# Patient Record
Sex: Male | Born: 1960 | Race: White | Hispanic: No | Marital: Married | Smoking: Former smoker
Health system: Southern US, Community
[De-identification: ages and names within clinical notes are randomized; demographics above are authoritative.]

## PROBLEM LIST (undated history)

## (undated) DIAGNOSIS — T7840XA Allergy, unspecified, initial encounter: Secondary | ICD-10-CM

## (undated) DIAGNOSIS — I219 Acute myocardial infarction, unspecified: Secondary | ICD-10-CM

## (undated) DIAGNOSIS — R001 Bradycardia, unspecified: Secondary | ICD-10-CM

## (undated) DIAGNOSIS — R319 Hematuria, unspecified: Secondary | ICD-10-CM

## (undated) DIAGNOSIS — E785 Hyperlipidemia, unspecified: Secondary | ICD-10-CM

## (undated) DIAGNOSIS — K5190 Ulcerative colitis, unspecified, without complications: Secondary | ICD-10-CM

## (undated) DIAGNOSIS — R569 Unspecified convulsions: Secondary | ICD-10-CM

## (undated) DIAGNOSIS — I2510 Atherosclerotic heart disease of native coronary artery without angina pectoris: Secondary | ICD-10-CM

## (undated) DIAGNOSIS — C801 Malignant (primary) neoplasm, unspecified: Secondary | ICD-10-CM

## (undated) HISTORY — DX: Acute myocardial infarction, unspecified: I21.9

## (undated) HISTORY — DX: Allergy, unspecified, initial encounter: T78.40XA

## (undated) HISTORY — PX: BIOPSY PROSTATE: PRO28

---

## 1988-04-28 HISTORY — PX: ANTERIOR CRUCIATE LIGAMENT REPAIR: SHX115

## 2004-12-26 ENCOUNTER — Ambulatory Visit (HOSPITAL_COMMUNITY): Admission: RE | Admit: 2004-12-26 | Discharge: 2004-12-26 | Payer: Self-pay | Admitting: Gastroenterology

## 2013-05-16 ENCOUNTER — Other Ambulatory Visit (HOSPITAL_COMMUNITY): Payer: Self-pay | Admitting: Urology

## 2013-05-16 DIAGNOSIS — R972 Elevated prostate specific antigen [PSA]: Secondary | ICD-10-CM

## 2013-05-29 DIAGNOSIS — C801 Malignant (primary) neoplasm, unspecified: Secondary | ICD-10-CM

## 2013-05-29 HISTORY — DX: Malignant (primary) neoplasm, unspecified: C80.1

## 2013-05-31 ENCOUNTER — Ambulatory Visit (HOSPITAL_COMMUNITY)
Admission: RE | Admit: 2013-05-31 | Discharge: 2013-05-31 | Disposition: A | Discharge status: Home / Self Care | Payer: 59 | Source: Ambulatory Visit | Attending: Urology | Admitting: Urology

## 2013-05-31 DIAGNOSIS — N402 Nodular prostate without lower urinary tract symptoms: Secondary | ICD-10-CM | POA: Insufficient documentation

## 2013-05-31 DIAGNOSIS — N400 Enlarged prostate without lower urinary tract symptoms: Secondary | ICD-10-CM | POA: Insufficient documentation

## 2013-05-31 DIAGNOSIS — R972 Elevated prostate specific antigen [PSA]: Secondary | ICD-10-CM | POA: Insufficient documentation

## 2013-05-31 MED ORDER — GADOBENATE DIMEGLUMINE 529 MG/ML IV SOLN
18.0000 mL | Freq: Once | INTRAVENOUS | Status: AC | PRN
  Administered 2013-05-31: 18 mL via INTRAVENOUS

## 2013-07-27 ENCOUNTER — Encounter (HOSPITAL_COMMUNITY): Payer: Self-pay | Admitting: Emergency Medicine

## 2013-07-27 ENCOUNTER — Encounter (HOSPITAL_COMMUNITY)
Admission: EM | Disposition: A | Discharge status: Home / Self Care | Payer: 59 | Source: Home / Self Care | Attending: Interventional Cardiology

## 2013-07-27 ENCOUNTER — Emergency Department (HOSPITAL_COMMUNITY): Payer: 59

## 2013-07-27 ENCOUNTER — Inpatient Hospital Stay (HOSPITAL_COMMUNITY)
Admission: EM | Admit: 2013-07-27 | Discharge: 2013-08-01 | DRG: 251 | Disposition: A | Discharge status: Home / Self Care | Payer: 59 | Attending: Interventional Cardiology | Admitting: Interventional Cardiology

## 2013-07-27 DIAGNOSIS — Z87891 Personal history of nicotine dependence: Secondary | ICD-10-CM

## 2013-07-27 DIAGNOSIS — R319 Hematuria, unspecified: Secondary | ICD-10-CM | POA: Diagnosis present

## 2013-07-27 DIAGNOSIS — I2510 Atherosclerotic heart disease of native coronary artery without angina pectoris: Secondary | ICD-10-CM

## 2013-07-27 DIAGNOSIS — R001 Bradycardia, unspecified: Secondary | ICD-10-CM

## 2013-07-27 DIAGNOSIS — I959 Hypotension, unspecified: Secondary | ICD-10-CM | POA: Diagnosis present

## 2013-07-27 DIAGNOSIS — R569 Unspecified convulsions: Secondary | ICD-10-CM | POA: Diagnosis present

## 2013-07-27 DIAGNOSIS — K5190 Ulcerative colitis, unspecified, without complications: Secondary | ICD-10-CM

## 2013-07-27 DIAGNOSIS — I213 ST elevation (STEMI) myocardial infarction of unspecified site: Secondary | ICD-10-CM

## 2013-07-27 DIAGNOSIS — C61 Malignant neoplasm of prostate: Secondary | ICD-10-CM

## 2013-07-27 DIAGNOSIS — K921 Melena: Secondary | ICD-10-CM | POA: Diagnosis present

## 2013-07-27 DIAGNOSIS — E785 Hyperlipidemia, unspecified: Secondary | ICD-10-CM | POA: Diagnosis present

## 2013-07-27 DIAGNOSIS — I2582 Chronic total occlusion of coronary artery: Secondary | ICD-10-CM | POA: Diagnosis present

## 2013-07-27 DIAGNOSIS — Z8546 Personal history of malignant neoplasm of prostate: Secondary | ICD-10-CM

## 2013-07-27 DIAGNOSIS — I2119 ST elevation (STEMI) myocardial infarction involving other coronary artery of inferior wall: Principal | ICD-10-CM

## 2013-07-27 HISTORY — DX: Ulcerative colitis, unspecified, without complications: K51.90

## 2013-07-27 HISTORY — DX: Hematuria, unspecified: R31.9

## 2013-07-27 HISTORY — DX: Unspecified convulsions: R56.9

## 2013-07-27 HISTORY — PX: LEFT HEART CATHETERIZATION WITH CORONARY ANGIOGRAM: SHX5451

## 2013-07-27 HISTORY — DX: Bradycardia, unspecified: R00.1

## 2013-07-27 HISTORY — DX: Malignant (primary) neoplasm, unspecified: C80.1

## 2013-07-27 LAB — POCT I-STAT TROPONIN I: Troponin i, poc: 0.01 ng/mL (ref 0.00–0.08)

## 2013-07-27 LAB — CBC
HCT: 44.1 % (ref 39.0–52.0)
HEMATOCRIT: 36.2 % — AB (ref 39.0–52.0)
Hemoglobin: 15.5 g/dL (ref 13.0–17.0)
Hemoglobin: 12.8 g/dL — ABNORMAL LOW (ref 13.0–17.0)
MCH: 29.5 pg (ref 26.0–34.0)
MCH: 29.6 pg (ref 26.0–34.0)
MCHC: 35.1 g/dL (ref 30.0–36.0)
MCHC: 35.4 g/dL (ref 30.0–36.0)
MCV: 84 fL (ref 78.0–100.0)
MCV: 83.8 fL (ref 78.0–100.0)
Platelets: 296 K/uL (ref 150–400)
Platelets: 265 10*3/uL (ref 150–400)
RBC: 5.25 MIL/uL (ref 4.22–5.81)
RBC: 4.32 MIL/uL (ref 4.22–5.81)
RDW: 12.4 % (ref 11.5–15.5)
RDW: 12.5 % (ref 11.5–15.5)
WBC: 7.9 K/uL (ref 4.0–10.5)
WBC: 9.2 10*3/uL (ref 4.0–10.5)

## 2013-07-27 LAB — POCT I-STAT, CHEM 8
BUN: 14 mg/dL (ref 6–23)
CALCIUM ION: 1.19 mmol/L (ref 1.12–1.23)
CHLORIDE: 107 meq/L (ref 96–112)
Creatinine, Ser: 0.8 mg/dL (ref 0.50–1.35)
Glucose, Bld: 150 mg/dL — ABNORMAL HIGH (ref 70–99)
HEMATOCRIT: 42 % (ref 39.0–52.0)
Hemoglobin: 14.3 g/dL (ref 13.0–17.0)
Potassium: 3.6 mEq/L — ABNORMAL LOW (ref 3.7–5.3)
SODIUM: 140 meq/L (ref 137–147)
TCO2: 17 mmol/L (ref 0–100)

## 2013-07-27 LAB — BASIC METABOLIC PANEL WITH GFR
BUN: 15 mg/dL (ref 6–23)
CO2: 20 meq/L (ref 19–32)
Calcium: 9.3 mg/dL (ref 8.4–10.5)
Chloride: 101 meq/L (ref 96–112)
Creatinine, Ser: 0.85 mg/dL (ref 0.50–1.35)
GFR calc Af Amer: >90 mL/min
GFR calc non Af Amer: >90 mL/min
Glucose, Bld: 160 mg/dL — ABNORMAL HIGH (ref 70–99)
Potassium: 4 meq/L (ref 3.7–5.3)
Sodium: 138 meq/L (ref 137–147)

## 2013-07-27 LAB — POCT ACTIVATED CLOTTING TIME
ACTIVATED CLOTTING TIME: 221 s
ACTIVATED CLOTTING TIME: 237 s
ACTIVATED CLOTTING TIME: 304 s
Activated Clotting Time: 293 seconds

## 2013-07-27 LAB — CK TOTAL AND CKMB (NOT AT ARMC)
CK, MB: 62.6 ng/mL (ref 0.3–4.0)
Relative Index: 8.2 — ABNORMAL HIGH (ref 0.0–2.5)
Total CK: 763 U/L — ABNORMAL HIGH (ref 7–232)

## 2013-07-27 LAB — MRSA PCR SCREENING: MRSA BY PCR: NEGATIVE

## 2013-07-27 LAB — PRO B NATRIURETIC PEPTIDE: Pro B Natriuretic peptide (BNP): 26.4 pg/mL (ref 0–125)

## 2013-07-27 SURGERY — LEFT HEART CATHETERIZATION WITH CORONARY ANGIOGRAM
Anesthesia: LOCAL

## 2013-07-27 MED ORDER — TIROFIBAN HCL IV 12.5 MG/250 ML
0.1500 ug/kg/min | INTRAVENOUS | Status: DC
  Administered 2013-07-27 – 2013-07-28 (×3): 0.15 ug/kg/min via INTRAVENOUS
  Filled 2013-07-27: qty 250
  Filled 2013-07-27 (×3): qty 100
  Filled 2013-07-27: qty 250
  Filled 2013-07-27: qty 100
  Filled 2013-07-27: qty 250
  Filled 2013-07-27: qty 100
  Filled 2013-07-27: qty 250

## 2013-07-27 MED ORDER — HEPARIN (PORCINE) IN NACL 2-0.9 UNIT/ML-% IJ SOLN
INTRAMUSCULAR | Status: AC
  Filled 2013-07-27: qty 1000

## 2013-07-27 MED ORDER — PRASUGREL HCL 10 MG PO TABS
ORAL_TABLET | ORAL | Status: AC
  Filled 2013-07-27: qty 1

## 2013-07-27 MED ORDER — SODIUM CHLORIDE 0.9 % IV SOLN: INTRAVENOUS | Status: DC

## 2013-07-27 MED ORDER — LIDOCAINE HCL (PF) 1 % IJ SOLN
INTRAMUSCULAR | Status: AC
  Filled 2013-07-27: qty 30

## 2013-07-27 MED ORDER — TIROFIBAN HCL IV 5 MG/100ML
INTRAVENOUS | Status: AC
  Filled 2013-07-27: qty 100

## 2013-07-27 MED ORDER — NITROGLYCERIN 0.4 MG SL SUBL
0.4000 mg | SUBLINGUAL_TABLET | SUBLINGUAL | Status: DC | PRN
  Administered 2013-07-27 – 2013-07-28 (×2): 0.4 mg via SUBLINGUAL

## 2013-07-27 MED ORDER — PRASUGREL HCL 10 MG PO TABS
10.0000 mg | ORAL_TABLET | Freq: Every day | ORAL | Status: DC
  Administered 2013-07-28: 10 mg via ORAL
  Filled 2013-07-27 (×2): qty 1

## 2013-07-27 MED ORDER — FENTANYL CITRATE 0.05 MG/ML IJ SOLN
INTRAMUSCULAR | Status: AC
  Filled 2013-07-27: qty 2

## 2013-07-27 MED ORDER — SODIUM CHLORIDE 0.9 % IV SOLN
INTRAVENOUS | Status: AC
  Administered 2013-07-27: 18:00:00 via INTRAVENOUS

## 2013-07-27 MED ORDER — ASPIRIN 325 MG PO TABS
325.0000 mg | ORAL_TABLET | ORAL | Status: AC
  Administered 2013-07-27: 325 mg via ORAL
  Filled 2013-07-27: qty 1

## 2013-07-27 MED ORDER — HEPARIN SODIUM (PORCINE) 1000 UNIT/ML IJ SOLN
INTRAMUSCULAR | Status: AC
  Filled 2013-07-27: qty 1

## 2013-07-27 MED ORDER — HEPARIN SODIUM (PORCINE) 5000 UNIT/ML IJ SOLN
4000.0000 [IU] | Freq: Once | INTRAMUSCULAR | Status: AC
  Administered 2013-07-27: 4000 [IU] via INTRAVENOUS

## 2013-07-27 MED ORDER — MIDAZOLAM HCL 2 MG/2ML IJ SOLN
INTRAMUSCULAR | Status: AC
  Filled 2013-07-27: qty 2

## 2013-07-27 MED ORDER — ASPIRIN 81 MG PO CHEW
81.0000 mg | CHEWABLE_TABLET | Freq: Every day | ORAL | Status: DC
  Administered 2013-07-28 – 2013-08-01 (×5): 81 mg via ORAL
  Filled 2013-07-27 (×5): qty 1

## 2013-07-27 MED ORDER — HEPARIN SODIUM (PORCINE) 5000 UNIT/ML IJ SOLN
INTRAMUSCULAR | Status: AC
  Filled 2013-07-27: qty 1

## 2013-07-27 MED ORDER — NITROGLYCERIN 0.2 MG/ML ON CALL CATH LAB
INTRAVENOUS | Status: AC
  Filled 2013-07-27: qty 1

## 2013-07-27 MED ORDER — VERAPAMIL HCL 2.5 MG/ML IV SOLN
INTRAVENOUS | Status: AC
  Filled 2013-07-27: qty 2

## 2013-07-27 NOTE — ED Notes (Addendum)
Pt reports CP starting 20 min ago, in center chest while using the bathroom.  States it feels like heartburn.  Denies radiation.  Pt reports pain in right arm.  Reports hx of ulcerative colitis.  Denies hx of chest pain.  Denies hx HTN and diabetes.  Pt reports feeling lightheaded.  Denies nausea.  Reports diaphoresis. Denies fever/chills.  Pt reports jaw pain.  Reports some SOB from pain in chest.  No acute distress, respirations equal and unlabored.  Skin warm and dry.

## 2013-07-27 NOTE — Progress Notes (Signed)
Right arm swollen from above wrist to elbow.  IV has good blood return and flushes easily.  Right radial band site level 0 under band, but some bruising noted towards elbow.  Consulted with cath lab staff at time of bedside report, site is the same as in the cath lab. Will continue to monitor pt closely.

## 2013-07-27 NOTE — Progress Notes (Signed)
Positive troponins and CKMBs are expected per Tenny Craw, PA.

## 2013-07-27 NOTE — ED Notes (Signed)
Patient transported to Cath lab

## 2013-07-27 NOTE — ED Notes (Signed)
Defib pads applied

## 2013-07-27 NOTE — Progress Notes (Signed)
CRITICAL VALUE ALERT  Critical value received: CKMB  Date of notification:  07/27/2013  Time of notification:  5844  Critical value read back:yes  Nurse who received alert:  Carleene Cooper  MD notified (1st page):  Tenny Craw  Time of first page:  [phone]  MD notified (2nd page):  Time of second page:  Responding MD:  Tenny Craw  Time MD responded:  [phone]

## 2013-07-27 NOTE — ED Notes (Signed)
STEMI coordinator arrives

## 2013-07-27 NOTE — Progress Notes (Signed)
Responded to code stemi  page to provide support to patient and family. Pt report to ED and was immediately taken to cath lab.  Pt was alert. Accompanied staff to cath lab and escorted wife to waiting area. Provided ministry of presence, empathic listening and hospitality. Will follow as needed.  07/27/13 0900  Clinical Encounter Type  Visited With Patient and family together;Health care provider  Visit Type Initial;Spiritual support;Pre-op  Referral From Nurse  Spiritual Encounters  Spiritual Needs Emotional  Stress Factors  Patient Stress Factors None identified  Family Stress Factors None identified  Jaclynn Major, [place]

## 2013-07-27 NOTE — ED Provider Notes (Addendum)
CSN: [id number]     Arrival date & time 07/27/13  0160 History   First MD Initiated Contact with Patient 07/27/13 [phone]     Chief Complaint  Patient presents with  . Chest Pain     (Consider location/radiation/quality/duration/timing/severity/associated sxs/prior Treatment) Patient is a 53 y.o. male presenting with chest pain. The history is provided by the patient and the spouse.  Chest Pain Associated symptoms: shortness of breath   Associated symptoms: no abdominal pain, no back pain, no fever, no headache, no nausea and not vomiting    onset of chest pain at 8:00 in the morning of right-sided substernal radiating to right arm up into the jaw and then started to radiate towards the left arm did not radiate to the back. Pain was 8/10. Some shortness of breath no nausea no vomiting. Stated felt some lightheadedness but did not pass out significant diaphoresis. No history of any similar type pain. Pain sharp in nature.  Past Medical History  Diagnosis Date  . Seizures     last seizure 26 years ago  . Cancer 05/29/2013    prostate  . Ulcerative colitis   . Elevated cholesterol    Past Surgical History  Procedure Laterality Date  . Biopsy prostate     History reviewed. No pertinent family history. History  Substance Use Topics  . Smoking status: Former Smoker    Types: Cigars    Quit date: 07/27/2008  . Smokeless tobacco: Never Used  . Alcohol Use: Yes     Comment: occassional beer or wine, < 1 per week    Review of Systems  Constitutional: Negative for fever.  HENT: Negative for congestion.   Eyes: Negative for redness.  Respiratory: Positive for shortness of breath.   Cardiovascular: Positive for chest pain.  Gastrointestinal: Negative for nausea, vomiting and abdominal pain.  Genitourinary: Negative for dysuria.  Musculoskeletal: Positive for neck pain. Negative for back pain.  Skin: Positive for rash.  Neurological: Positive for light-headedness. Negative for syncope  and headaches.  Hematological: Does not bruise/bleed easily.  Psychiatric/Behavioral: Negative for confusion.      Allergies  Review of patient's allergies indicates not on file.  Home Medications  No current outpatient prescriptions on file. BP 127/88  Pulse 62  Temp(Src) 98.2 F (36.8 C) (Oral)  Resp 16  SpO2 100% Physical Exam  Nursing note and vitals reviewed. Constitutional: He is oriented to person, place, and time. He appears well-developed and well-nourished. He appears distressed.  HENT:  Head: Normocephalic and atraumatic.  Mouth/Throat: Oropharynx is clear and moist.  Eyes: Conjunctivae and EOM are normal. Pupils are equal, round, and reactive to light.  Neck: Normal range of motion.  Cardiovascular: Normal rate, regular rhythm and normal heart sounds.   Pulmonary/Chest: Effort normal and breath sounds normal. No respiratory distress.  Abdominal: Soft. Bowel sounds are normal. There is no tenderness.  Musculoskeletal: Normal range of motion. He exhibits no edema.  Neurological: He is alert and oriented to person, place, and time. No cranial nerve deficit. He exhibits normal muscle tone. Coordination normal.  Skin: Skin is warm.    ED Course  Procedures (including critical care time) Labs Review Labs Reviewed  [place], ED   Imaging Review No results found.   EKG Interpretation   Date/Time:  Wednesday July 27 2013 08:51:06 EDT Ventricular Rate:  54 PR Interval:  226 QRS Duration: 104 QT Interval:  462 QTC Calculation: 438  R Axis:   90 Text Interpretation:  Sinus arrhythmia Prolonged PR interval Borderline  right axis deviation Nonspecific T abnrm, anterolateral leads ST  elevation, consider inferior injury ** ** ACUTE MI / STEMI ** ** First EKG  today very concerning for inferior injury patttern. This secound EKG  consistent with inferior MI.  Confirmed by Rogene Houston  MD, Hidaya Daniel [phone])  on  07/27/2013 9:08:24 AM      CRITICAL CARE Performed by: Mervin Kung. Total critical care time: 30 Critical care time was exclusive of separately billable procedures and treating other patients. Critical care was necessary to treat or prevent imminent or life-threatening deterioration. Critical care was time spent personally by me on the following activities: development of treatment plan with patient and/or surrogate as well as nursing, discussions with consultants, evaluation of patient's response to treatment, examination of patient, obtaining history from patient or surrogate, ordering and performing treatments and interventions, ordering and review of laboratory studies, ordering and review of radiographic studies, pulse oximetry and re-evaluation of patient's condition.     MDM   Final diagnoses:  STEMI (ST elevation myocardial infarction)    Patient with an excellent story for a myocardial infarction. Right sided and substernal chest pain starting at 8:00 in the morning. 8/10 did not resolve. Patient initially thought it was indigestion. Associated with diaphoresis pain radiated to right arm some total left arm and up into the jaw. Patient's past medical history negative for any coronary artery disease. Patient does not have a cardiologist.  First EKG of concerning for inferior injury pattern based on his excellent story requested second EKG were consistent with inferior MI. Heparin ordered the patient received aspirin IV initiated. Portable chest the Re: been ordered and was done prior to going to cath lab. Discussed with cardiology patient to cath lab. Discussed with patient's family and patient and informed them what was going to happen or concerns were. IV bolus 250 cc normal saline ordered. Along with aspirin nitroglycerin and heparin bolus of 4000 units. Patient's blood pressure is not elevated 127/88.  Also of note the first EKG was never brought to me. I reviewed the  first EKG on the use when I signed up for the patient. Concerned about what was present inferiorly on the first EKG when immediately to the patient's room history was very consistent with an MI. Repeat EKG confirmed my suspicions. Code STEMI called.      Mervin Kung, MD 07/27/13 [place], MD 07/27/13 [phone]

## 2013-07-27 NOTE — CV Procedure (Signed)
PROCEDURE:  Left heart catheterization with selective coronary angiography, left ventriculogram.  Aspiration thrombectomy of the RCA. PTCA of the RCA/PDA  INDICATIONS:  Inferior STEMI  The risks, benefits, and details of the procedure were explained to the patient.  The patient verbalized understanding and wanted to proceed. Emergency consent.  PROCEDURE TECHNIQUE:  After Xylocaine anesthesia a 19F slender sheath was placed in the right radial artery with a single anterior needle wall stick.   IV heparin was given. Right coronary angiography was done using a Judkins R4 guide catheter.  Left coronary angiography was done using a Judkins L3.5 guide catheter.  Left ventriculography was done using a pigtail catheter. IV tirofiban and heparin were used for anticoagulation. An ACT was used to check that these were therapeutic. A TR band was used for hemostasis.   CONTRAST:  Total of 230 cc.  COMPLICATIONS:  None.    HEMODYNAMICS:  Aortic pressure was 95/63; LV pressure was 94/7; LVEDP 18.  There was no gradient between the left ventricle and aorta.    ANGIOGRAPHIC DATA:   The left main coronary artery is widely patenty.  The left anterior descending artery is a large vessel proximally. There is mild ectasia in the proximal vessel. There is a large first diagonal which is widely patent. The remainder of the mid to distal LAD is widely patent.  The left circumflex artery is a large vessel proximally. There is a large first obtuse marginal which is widely patent and branches across the lateral wall. The remainder of the circumflex is medium-sized with several branches which are all patent.  The right coronary artery is a large vessel proximally. There is severe ectasia in the proximal, mid and distal portion of the vessel. At the distal RCA, there is a large thrombus burden. This is likely occurring just at the bifurcation of the posterior lateral artery and the posterior descending artery. We  cannot exactly see where the posterior lateral artery originates do to an apparent flush occlusion. In the posterior descending artery, there is a 99% thrombotic lesion.  LEFT VENTRICULOGRAM:  Left ventricular angiogram was done in the 30 RAO projection and revealed basal to mid hypokinesis of the inferior wall and systolic function with an estimated ejection fraction of 50%.  LVEDP was 18 mmHg.  PCI NARRATIVE: A JR 4 Guiding catheter was used.  A Prowater wire was placed across the thrombus in the distal RCA and PDA.  An aspiration catheter was advanced and thrombectomy was performed. When removing aspiration catheter, or unable to maintain wire position. It appeared after everything was out of the body that the wire was wrapped around the catheter. We then put another pro-water wire down. A 2.5 x 12 balloon was used to predilate the lesion. There was no significant change. There is difficulty with guide catheter support. We tried aspiration with a different catheter. This did not navigate the bend at the distal RCA.   We then switched to an AL-1 guide catheter. We placed a Fielder XT wire down into the PDA. Intra-arterial tirofiban was given, 10 cc from the bag. A 3.0 and a 3.5 balloon were used to dilate the diseased area. There is clearly a large area of thrombus in the distal RCA remaining. This is likely obstructing the origin of the posterior lateral artery. The patient did have left to right collaterals.  At that point, the patient's symptoms were significantly improved. He was hemodynamically stable. My concern was that if we  continue to try to manipulate into the posterior lateral artery, we may embolize thrombus and cause microvascular obstruction and thus infarction. At least at this point, he has good collateral flow. We will anticoagulate and then bring him back for relook in a few days.  IMPRESSIONS:  1. Normal left main coronary artery. 2. Widely patent left anterior descending artery and  its branches. Mild ectasia in the proximal LAD. 3. Widely patent left circumflex artery and its branches. 4. Aneurysmal right coronary artery.  Large thrombus burden in the distal RCA likely at the bifurcation of the posterior lateral artery and posterior descending artery. The distal RCA and posterior descending artery were successfully balloon. There is a 20% residual stenosis in the PDA. There is TIMI 3 flow. The posterior lateral artery is occluded with a large thrombus burden in the distal RCA. 5. Normal left ventricular systolic function.  LVEDP 18 mmHg.  Ejection fraction 50 %.  RECOMMENDATION:  Plan for 24-48 hours of  GP IIb IIIa inhibitor. Hopefully this will help with the residual thrombus burden. We'll plan on re cath on either Thursday or Friday.  Medical therapy for now including statin and prasugrel.  His heart rate will not tolerate a beta blocker at this time.

## 2013-07-27 NOTE — H&P (Signed)
    Stryder Poitra is an 53 y.o. male.   Chief Complaint: Chest pain HPI:   The patient is a 53 yo caucasian male with a history of ulcerative colitis, with recent hematochezia, HLD, prostate cancer and seizure(last age 51).   He reports developing chest pain at 0800hrs this morning with radiation to the right arm, diaphoresis and light-headedness. EKG revealed ST elevation inferiorly.    Past Medical History  Diagnosis Date  . Seizures     last seizure 26 years ago  . Cancer 05/29/2013    prostate  . Ulcerative colitis   . Elevated cholesterol     Past Surgical History  Procedure Laterality Date  . Biopsy prostate      History reviewed. No pertinent family history. Social History:  reports that he quit smoking about 5 years ago. His smoking use included Cigars. He has never used smokeless tobacco. He reports that he drinks alcohol. He reports that he does not use illicit drugs.  Allergies: No Known Allergies  No prescriptions prior to admission    Results for orders placed during the hospital encounter of 07/27/13 (from the past 48 hour(s))  CBC     Status: None   Collection Time    07/27/13  8:47 AM      Result Value Ref Range   WBC 7.9  4.0 - 10.5 K/uL   RBC 5.25  4.22 - 5.81 MIL/uL   Hemoglobin 15.5  13.0 - 17.0 g/dL   HCT 44.1  39.0 - 52.0 %   MCV 84.0  78.0 - 100.0 fL   MCH 29.5  26.0 - 34.0 pg   MCHC 35.1  30.0 - 36.0 g/dL   RDW 12.4  11.5 - 15.5 %   Platelets 296  150 - 400 K/uL  POCT I-STAT TROPONIN I     Status: None   Collection Time    07/27/13  9:04 AM      Result Value Ref Range   Troponin i, poc 0.01  0.00 - 0.08 ng/mL   Comment 3            Comment: Due to the release kinetics of cTnI,     a negative result within the first hours     of the onset of symptoms does not rule out     myocardial infarction with certainty.     If myocardial infarction is still suspected,     repeat the test at appropriate intervals.   No results found.  Review of  Systems  Constitutional: Positive for diaphoresis.  Cardiovascular: Positive for chest pain.  Neurological: Positive for dizziness.    Blood pressure 127/88, pulse 62, temperature 98.2 F (36.8 C), temperature source Oral, resp. rate 16, weight 194 lb 0.1 oz (88 kg), SpO2 100.00%. Physical Exam   Assessment/Plan Principal Problem:   STEMI (ST elevation myocardial infarction), inferior Active Problems:   Ulcerative colitis   HLD (hyperlipidemia)  Plan:  The patient was taken emergently to the cath lab.  The patient did report recent hematochezia.  Will need to follow H&H closely.   HAGER, BRYAN 07/27/2013, 9:18 AM   I have examined the patient and reviewed assessment and plan and discussed with patient.  Agree with above as stated.  Would lean towards bare metal stent if needed due to issues with UC.    Renaud Celli S.

## 2013-07-27 NOTE — Progress Notes (Signed)
Patient with swelling in right forearm from right radial cath this AM.  TR band is off and swelling does not appear to be getting worse but there is swelling up to the elbow.  Discussed with Dr. Irish Lack and decided to place a ace bandage on the forearm from the wrist to the elbow.

## 2013-07-28 ENCOUNTER — Encounter (HOSPITAL_COMMUNITY): Payer: Self-pay | Admitting: Interventional Cardiology

## 2013-07-28 DIAGNOSIS — R001 Bradycardia, unspecified: Secondary | ICD-10-CM | POA: Insufficient documentation

## 2013-07-28 DIAGNOSIS — I498 Other specified cardiac arrhythmias: Secondary | ICD-10-CM

## 2013-07-28 DIAGNOSIS — R319 Hematuria, unspecified: Secondary | ICD-10-CM

## 2013-07-28 LAB — BASIC METABOLIC PANEL
BUN: 12 mg/dL (ref 6–23)
CALCIUM: 8 mg/dL — AB (ref 8.4–10.5)
CO2: 18 meq/L — AB (ref 19–32)
Chloride: 107 mEq/L (ref 96–112)
Creatinine, Ser: 0.76 mg/dL (ref 0.50–1.35)
GFR calc Af Amer: >90 mL/min (ref >90)
GLUCOSE: 115 mg/dL — AB (ref 70–99)
Potassium: 3.7 mEq/L (ref 3.7–5.3)
Sodium: 139 mEq/L (ref 137–147)

## 2013-07-28 LAB — CBC
HCT: 35.6 % — ABNORMAL LOW (ref 39.0–52.0)
HEMOGLOBIN: 12.4 g/dL — AB (ref 13.0–17.0)
MCH: 29.4 pg (ref 26.0–34.0)
MCHC: 34.8 g/dL (ref 30.0–36.0)
MCV: 84.4 fL (ref 78.0–100.0)
PLATELETS: 250 10*3/uL (ref 150–400)
RBC: 4.22 MIL/uL (ref 4.22–5.81)
RDW: 12.7 % (ref 11.5–15.5)
WBC: 10.6 10*3/uL — ABNORMAL HIGH (ref 4.0–10.5)

## 2013-07-28 LAB — CK TOTAL AND CKMB (NOT AT ARMC)
CK, MB: 108 ng/mL (ref 0.3–4.0)
Relative Index: 6.8 — ABNORMAL HIGH (ref 0.0–2.5)
Total CK: 1593 U/L — ABNORMAL HIGH (ref 7–232)

## 2013-07-28 MED ORDER — SODIUM CHLORIDE 0.9 % IV SOLN: 250.0000 mL | INTRAVENOUS | Status: DC | PRN

## 2013-07-28 MED ORDER — ACETAMINOPHEN 325 MG PO TABS: 650.0000 mg | ORAL_TABLET | ORAL | Status: DC | PRN

## 2013-07-28 MED ORDER — SODIUM CHLORIDE 0.9 % IJ SOLN
3.0000 mL | Freq: Two times a day (BID) | INTRAMUSCULAR | Status: DC
  Administered 2013-07-28: 3 mL via INTRAVENOUS

## 2013-07-28 MED ORDER — SODIUM CHLORIDE 0.9 % IJ SOLN: 3.0000 mL | INTRAMUSCULAR | Status: DC | PRN

## 2013-07-28 MED ORDER — ONDANSETRON HCL 4 MG/2ML IJ SOLN: 4.0000 mg | Freq: Four times a day (QID) | INTRAMUSCULAR | Status: DC | PRN

## 2013-07-28 MED ORDER — SODIUM CHLORIDE 0.9 % IV SOLN: INTRAVENOUS | Status: DC

## 2013-07-28 MED ORDER — ASPIRIN 81 MG PO CHEW
81.0000 mg | CHEWABLE_TABLET | ORAL | Status: AC
  Administered 2013-07-29: 81 mg via ORAL
  Filled 2013-07-28: qty 1

## 2013-07-28 NOTE — Progress Notes (Addendum)
SUBJECTIVE:  Had some mild discomfort yesterday that was relieved with NTG.  OBJECTIVE:   Vitals:   Filed Vitals:   07/28/13 0700 07/28/13 0800 07/28/13 0802 07/28/13 0822  BP: 92/48 78/38 87/45  80/44  Pulse: 56 53 54 57  Temp:  98.3 F (36.8 C)    TempSrc:  Oral    Resp: 15 16 12 18   Height:      Weight:      SpO2: 99% 99% 98% 98%   I&O's:   Intake/Output Summary (Last 24 hours) at 07/28/13 1023 Last data filed at 07/28/13 0800  Gross per 24 hour  Intake 2039.3 ml  Output    850 ml  Net 1189.3 ml   TELEMETRY: Reviewed telemetry pt in sinus bradycardia, occasional junctional rhythm; occasional wide complex rhythm at the sinus rate yesterday.:     PHYSICAL EXAM General: Well developed, well nourished, in no acute distress Head:   Normal cephalic and atramatic  Lungs:   Clear bilaterally to auscultation. Heart:   HRRR S1 S2  No JVD.   Abdomen: abdomen soft and non-tender Msk:  Back normal,  Normal strength and tone for age. Extremities:   No edema.  3+ right radial pulse, right hand swollen; mild bruising; forearm swollen but soft Neuro: Alert and oriented. Psych:  Normal affect, responds appropriately   LABS: Basic Metabolic Panel:  Recent Labs  07/27/13 0847 07/27/13 0924 07/28/13 0357  NA 138 140 139  K 4.0 3.6* 3.7  CL 101 107 107  CO2 20  --  18*  GLUCOSE 160* 150* 115*  BUN 15 14 12   CREATININE 0.85 0.80 0.76  CALCIUM 9.3  --  8.0*   Liver Function Tests: No results found for this basename: AST, ALT, ALKPHOS, BILITOT, PROT, ALBUMIN,  in the last 72 hours No results found for this basename: LIPASE, AMYLASE,  in the last 72 hours CBC:  Recent Labs  07/27/13 1922 07/28/13 0357  WBC 9.2 10.6*  HGB 12.8* 12.4*  HCT 36.2* 35.6*  MCV 83.8 84.4  PLT 265 250   Cardiac Enzymes:  Recent Labs  07/27/13 1640 07/28/13 0357  CKTOTAL 763* 1593*  CKMB 62.6* 108.0*   BNP: No components found with this basename: POCBNP,  D-Dimer: No results  found for this basename: DDIMER,  in the last 72 hours Hemoglobin A1C: No results found for this basename: HGBA1C,  in the last 72 hours Fasting Lipid Panel: No results found for this basename: CHOL, HDL, LDLCALC, TRIG, CHOLHDL, LDLDIRECT,  in the last 72 hours Thyroid Function Tests: No results found for this basename: TSH, T4TOTAL, FREET3, T3FREE, THYROIDAB,  in the last 72 hours Anemia Panel: No results found for this basename: VITAMINB12, FOLATE, FERRITIN, TIBC, IRON, RETICCTPCT,  in the last 72 hours Coag Panel:   No results found for this basename: INR, PROTIME    RADIOLOGY: Dg Chest Port 1 View  07/27/2013   CLINICAL DATA:  Chest pain.  EXAM: PORTABLE CHEST - 1 VIEW  COMPARISON:  03/01/2012.  FINDINGS: Scoliosis. Normal cardiomediastinal silhouette. No focal infiltrate or CHF. Slight retrocardiac subsegmental atelectasis and slight blunting left CP angle. Slight worsening aeration from priors.  IMPRESSION: No active infiltrates or failure. Question mild atelectasis and slight effusion left base.   Electronically Signed   By: Rolla Flatten M.D.   On: 07/27/2013 09:16      ASSESSMENT/PLAN:    1) Acute inferolateral MI: Left to right collaterals to a large PLA.  Distal RCA and  PLA filled with thrombus.  PDA ballooned aggressively.  COntinue IV tirofiban.  Plan for relook cath tomorrow morning.  Aneurysmal RCA.  Explained to patient that I do not know if we will find anything different.  Hopefully, there will be resolution of thrombus.  If not, he may have a chronically occluded distal RCA with collaterals.  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  If the PLA or PDA has disease that may benefit from stenting, this could be considered.  Will discuss with Dr. Tamala Julian before the cath.  2) He is having some arrhythmia as noted above which is likely related to his MI.  Watch in the ICU for now.  This will likely resolve over the  next few days.  Reviewed ECG.  There appears to be some junction rhythm with either retrograde P waves or transient complete heart block.  Likely MI related.  Hematuria: spoke to Dr. Gaynelle Arabian yesterday.  Patient had recent prostate biopsy  Antiplatelet therapy is ok.    U.C.: No recent BM.  I would not be surprised if he had some bloody diarrhea.  Would have to consider BMS if stent needed due to issues with chronic ulcerative colitis.    Labs stable.  Questions about cath answered.  Had some mild right forearm vbleeding.  Good perfusion.  May need to use left arm tomorrow.   Jettie Booze., MD  07/28/2013  10:23 AM

## 2013-07-28 NOTE — Progress Notes (Addendum)
[id number] Cardiac Rehab Pt is not appro for ambulation. Started MI education with pt and gave him MI booklet.We discussed risk factors, coronary thrombus, activity restrictions and Outpt. CRP. He is thinking about Outpt. CRP. I left him copy of heart healthy diet. Pt states that he and his wife really try to eat healthy. We will continue to follow pt. Deon Pilling, RN 07/28/2013 11:11 AM

## 2013-07-29 ENCOUNTER — Encounter (HOSPITAL_COMMUNITY)
Admission: EM | Disposition: A | Discharge status: Home / Self Care | Payer: Self-pay | Source: Home / Self Care | Attending: Interventional Cardiology

## 2013-07-29 HISTORY — PX: LEFT HEART CATHETERIZATION WITH CORONARY ANGIOGRAM: SHX5451

## 2013-07-29 LAB — CK TOTAL AND CKMB (NOT AT ARMC)
CK, MB: 13.2 ng/mL — AB (ref 0.3–4.0)
Relative Index: 1.9 (ref 0.0–2.5)
Total CK: 711 U/L — ABNORMAL HIGH (ref 7–232)

## 2013-07-29 LAB — CBC
HCT: 32.4 % — ABNORMAL LOW (ref 39.0–52.0)
Hemoglobin: 11.2 g/dL — ABNORMAL LOW (ref 13.0–17.0)
MCH: 29 pg (ref 26.0–34.0)
MCHC: 34.6 g/dL (ref 30.0–36.0)
MCV: 83.9 fL (ref 78.0–100.0)
PLATELETS: 221 10*3/uL (ref 150–400)
RBC: 3.86 MIL/uL — ABNORMAL LOW (ref 4.22–5.81)
RDW: 12.6 % (ref 11.5–15.5)
WBC: 11.6 10*3/uL — ABNORMAL HIGH (ref 4.0–10.5)

## 2013-07-29 LAB — BASIC METABOLIC PANEL
BUN: 12 mg/dL (ref 6–23)
CO2: 18 mEq/L — ABNORMAL LOW (ref 19–32)
Calcium: 7.9 mg/dL — ABNORMAL LOW (ref 8.4–10.5)
Chloride: 102 mEq/L (ref 96–112)
Creatinine, Ser: 0.78 mg/dL (ref 0.50–1.35)
GFR calc Af Amer: >90 mL/min (ref >90)
GLUCOSE: 108 mg/dL — AB (ref 70–99)
POTASSIUM: 3.5 meq/L — AB (ref 3.7–5.3)
SODIUM: 134 meq/L — AB (ref 137–147)

## 2013-07-29 LAB — PROTIME-INR
INR: 1.11 (ref 0.00–1.49)
PROTHROMBIN TIME: 14.1 s (ref 11.6–15.2)

## 2013-07-29 LAB — LIPID PANEL
CHOL/HDL RATIO: 4.1 ratio
Cholesterol: 145 mg/dL (ref 0–200)
HDL: 35 mg/dL — ABNORMAL LOW (ref >39)
LDL Cholesterol: 91 mg/dL (ref 0–99)
Triglycerides: 94 mg/dL (ref <150)
VLDL: 19 mg/dL (ref 0–40)

## 2013-07-29 SURGERY — LEFT HEART CATHETERIZATION WITH CORONARY ANGIOGRAM
Anesthesia: LOCAL

## 2013-07-29 MED ORDER — LIDOCAINE HCL (PF) 1 % IJ SOLN
INTRAMUSCULAR | Status: AC
  Filled 2013-07-29: qty 30

## 2013-07-29 MED ORDER — MIDAZOLAM HCL 2 MG/2ML IJ SOLN
INTRAMUSCULAR | Status: AC
  Filled 2013-07-29: qty 2

## 2013-07-29 MED ORDER — VERAPAMIL HCL 2.5 MG/ML IV SOLN
INTRAVENOUS | Status: AC
  Filled 2013-07-29: qty 2

## 2013-07-29 MED ORDER — SODIUM CHLORIDE 0.9 % IV SOLN
INTRAVENOUS | Status: AC
  Administered 2013-07-29: 11:00:00 via INTRAVENOUS

## 2013-07-29 MED ORDER — HEPARIN (PORCINE) IN NACL 2-0.9 UNIT/ML-% IJ SOLN
INTRAMUSCULAR | Status: AC
Start: 2013-07-29 — End: 2013-07-29
  Filled 2013-07-29: qty 1000

## 2013-07-29 MED ORDER — ATORVASTATIN CALCIUM 10 MG PO TABS
10.0000 mg | ORAL_TABLET | Freq: Every day | ORAL | Status: DC
  Administered 2013-07-29: 10 mg via ORAL
  Filled 2013-07-29 (×2): qty 1

## 2013-07-29 MED ORDER — FENTANYL CITRATE 0.05 MG/ML IJ SOLN
INTRAMUSCULAR | Status: AC
  Filled 2013-07-29: qty 2

## 2013-07-29 MED ORDER — ACETAMINOPHEN 325 MG PO TABS
650.0000 mg | ORAL_TABLET | Freq: Four times a day (QID) | ORAL | Status: DC | PRN
  Administered 2013-07-29 – 2013-07-30 (×5): 650 mg via ORAL
  Filled 2013-07-29 (×5): qty 2

## 2013-07-29 MED ORDER — POTASSIUM CHLORIDE CRYS ER 20 MEQ PO TBCR
40.0000 meq | EXTENDED_RELEASE_TABLET | Freq: Once | ORAL | Status: AC
  Administered 2013-07-29: 40 meq via ORAL
  Filled 2013-07-29: qty 2

## 2013-07-29 NOTE — Interval H&P Note (Signed)
Cath Lab Visit (complete for each Cath Lab visit)  Clinical Evaluation Leading to the Procedure:   ACS: yes  Non-ACS:    Anginal Classification: CCS III  Anti-ischemic medical therapy: Maximal Therapy (2 or more classes of medications)  Non-Invasive Test Results: No non-invasive testing performed  Prior CABG: No previous CABG      History and Physical Interval Note:  07/29/2013 7:46 AM  [place]  has presented today for surgery, with the diagnosis of relook  The various methods of treatment have been discussed with the patient and family. After consideration of risks, benefits and other options for treatment, the patient has consented to  Procedure(s): LEFT HEART CATHETERIZATION WITH CORONARY ANGIOGRAM (N/A) as a surgical intervention .  The patient's history has been reviewed, patient examined, no change in status, stable for surgery.  I have reviewed the patient's chart and labs.  Questions were answered to the patient's satisfaction.     Sinclair Grooms

## 2013-07-29 NOTE — Interval H&P Note (Signed)
History and Physical Interval Note:  07/29/2013 7:44 AM Angiogram from 48 hours ago were reviewed. Discussed with Dr. Irish Lack. This case is being done to reassess right coronary after a less than optimal PCI results do to thrombus and sluggish flow. [place]  has presented today for surgery, with the diagnosis of relook  The various methods of treatment have been discussed with the patient and family. After consideration of risks, benefits and other options for treatment, the patient has consented to  Procedure(s): LEFT HEART CATHETERIZATION WITH CORONARY ANGIOGRAM (N/A) as a surgical intervention .  The patient's history has been reviewed, patient examined, no change in status, stable for surgery.  I have reviewed the patient's chart and labs.  Questions were answered to the patient's satisfaction.     Sinclair Grooms

## 2013-07-29 NOTE — Plan of Care (Signed)
Problem: Phase I Progression Outcomes Goal: Initial discharge plan identified Home with wife.

## 2013-07-29 NOTE — Progress Notes (Signed)
SUBJECTIVE:  Had some mild discomfort yesterday morning that was relieved with NTG.  None after that.  OBJECTIVE:   Vitals:   Filed Vitals:   07/29/13 1015 07/29/13 1030 07/29/13 1045 07/29/13 1100  BP: 92/55 92/61 95/55  102/59  Pulse: 57 64 58 64  Temp:      TempSrc:      Resp: 15 19 15 18   Height:      Weight:      SpO2: 97% 99% 98% 95%   I&O's:    Intake/Output Summary (Last 24 hours) at 07/29/13 1217 Last data filed at 07/29/13 1100  Gross per 24 hour  Intake 1416.52 ml  Output    475 ml  Net 941.52 ml   TELEMETRY: Reviewed telemetry pt in sinus bradycardia, occasional junctional rhythm; occasional wide complex rhythm at the sinus rate yesterday.:     PHYSICAL EXAM General: Well developed, well nourished, in no acute distress Head:   Normal cephalic and atramatic  Lungs:   Clear bilaterally to auscultation. Heart:   HRRR S1 S2  No JVD.   Abdomen: abdomen soft and non-tender Msk:  Back normal,  Normal strength and tone for age. Extremities:   No edema.  3+ right radial pulse, right hand swollen; mild bruising; forearm swollen but soft Neuro: Alert and oriented. Psych:  Normal affect, responds appropriately   LABS: Basic Metabolic Panel:  Recent Labs  07/28/13 0357 07/29/13 0332  NA 139 134*  K 3.7 3.5*  CL 107 102  CO2 18* 18*  GLUCOSE 115* 108*  BUN 12 12  CREATININE 0.76 0.78  CALCIUM 8.0* 7.9*   Liver Function Tests: No results found for this basename: AST, ALT, ALKPHOS, BILITOT, PROT, ALBUMIN,  in the last 72 hours No results found for this basename: LIPASE, AMYLASE,  in the last 72 hours CBC:  Recent Labs  07/28/13 0357 07/29/13 0332  WBC 10.6* 11.6*  HGB 12.4* 11.2*  HCT 35.6* 32.4*  MCV 84.4 83.9  PLT 250 221   Cardiac Enzymes:  Recent Labs  07/27/13 1640 07/28/13 0357 07/29/13 0332  CKTOTAL 763* 1593* 711*  CKMB 62.6* 108.0* 13.2*   BNP: No components found with this basename: POCBNP,  D-Dimer: No results found for  this basename: DDIMER,  in the last 72 hours Hemoglobin A1C: No results found for this basename: HGBA1C,  in the last 72 hours Fasting Lipid Panel:  Recent Labs  07/29/13 0332  CHOL 145  HDL 35*  LDLCALC 91  TRIG 94  CHOLHDL 4.1   Thyroid Function Tests: No results found for this basename: TSH, T4TOTAL, FREET3, T3FREE, THYROIDAB,  in the last 72 hours Anemia Panel: No results found for this basename: VITAMINB12, FOLATE, FERRITIN, TIBC, IRON, RETICCTPCT,  in the last 72 hours Coag Panel:   Lab Results  Component Value Date   INR 1.11 07/29/2013    RADIOLOGY: Dg Chest Port 1 View  07/27/2013   CLINICAL DATA:  Chest pain.  EXAM: PORTABLE CHEST - 1 VIEW  COMPARISON:  03/01/2012.  FINDINGS: Scoliosis. Normal cardiomediastinal silhouette. No focal infiltrate or CHF. Slight retrocardiac subsegmental atelectasis and slight blunting left CP angle. Slight worsening aeration from priors.  IMPRESSION: No active infiltrates or failure. Question mild atelectasis and slight effusion left base.   Electronically Signed   By: Rolla Flatten M.D.   On: 07/27/2013 09:16      ASSESSMENT/PLAN:    1) Acute inferolateral MI: Left to right collaterals to a large PLA.  Distal RCA  and PLA filled with thrombus.  PDA ballooned aggressively on 4/1.  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  Discussed with the patient and his wife that further attempt at intervention would likely result in distal embolization and larger MI 2 to obstruction of micro circulation.  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 All of their questions were answered and they are satisfied with the plan of care. Long term, he may not tolerate dual antiplatelet therapy due to his ulcerative colitis.  2) He is having some arrhythmia  as noted above which is likely related to his MI.  Watch in the ICU for now.  This is getting better and will likely resolve over the next few days.  Reviewed ECG on 4/3.  There appears to be some junctional rhythm with either retrograde P waves or transient complete heart block.  Likely MI related.  No further issues like this today.  Hematuria: spoke to Dr. Gaynelle Arabian yesterday.  Patient had recent prostate biopsy  Antiplatelet therapy is ok.    U.C.: No recent BM.  I would not be surprised if he had some bloody diarrhea.  Would have to consider BMS if stent needed due to issues with chronic ulcerative colitis.    Anticipate that he will be in a move out of the unit tomorrow unless he has any significant rhythm issues. I expect he will be here for a few more days and possibly home on Sunday or Monday.  Jettie Booze., MD  07/29/2013  12:17 PM

## 2013-07-29 NOTE — Progress Notes (Addendum)
Spoke w/ JV and with patient.   Pt is doing well, BP improved from earlier. No chest pain or SOB. Rhythm is SR, stable. Denies orthostatic symptoms. He is on ASA, statin (low dose) post-MI, but no BB secondary to hypotension. Effient d/c'd, likely secondary to hematuria. May also get bloody stools secondary to UC.  OK to tx telemetry and continue to monitor. MD advise on adding Plavix in am if no bleeding issues since he had aspiration thrombectomy of the RCA and PTCA of the RCA/PDA on 04/01 2nd to STEMI.

## 2013-07-29 NOTE — H&P (View-Only) (Signed)
SUBJECTIVE:  Had some mild discomfort yesterday that was relieved with NTG.  OBJECTIVE:   Vitals:   Filed Vitals:   07/28/13 0700 07/28/13 0800 07/28/13 0802 07/28/13 0822  BP: 92/48 78/38 87/45  80/44  Pulse: 56 53 54 57  Temp:  98.3 F (36.8 C)    TempSrc:  Oral    Resp: 15 16 12 18   Height:      Weight:      SpO2: 99% 99% 98% 98%   I&O's:   Intake/Output Summary (Last 24 hours) at 07/28/13 1023 Last data filed at 07/28/13 0800  Gross per 24 hour  Intake 2039.3 ml  Output    850 ml  Net 1189.3 ml   TELEMETRY: Reviewed telemetry pt in sinus bradycardia, occasional junctional rhythm; occasional wide complex rhythm at the sinus rate yesterday.:     PHYSICAL EXAM General: Well developed, well nourished, in no acute distress Head:   Normal cephalic and atramatic  Lungs:   Clear bilaterally to auscultation. Heart:   HRRR S1 S2  No JVD.   Abdomen: abdomen soft and non-tender Msk:  Back normal,  Normal strength and tone for age. Extremities:   No edema.  3+ right radial pulse, right hand swollen; mild bruising; forearm swollen but soft Neuro: Alert and oriented. Psych:  Normal affect, responds appropriately   LABS: Basic Metabolic Panel:  Recent Labs  07/27/13 0847 07/27/13 0924 07/28/13 0357  NA 138 140 139  K 4.0 3.6* 3.7  CL 101 107 107  CO2 20  --  18*  GLUCOSE 160* 150* 115*  BUN 15 14 12   CREATININE 0.85 0.80 0.76  CALCIUM 9.3  --  8.0*   Liver Function Tests: No results found for this basename: AST, ALT, ALKPHOS, BILITOT, PROT, ALBUMIN,  in the last 72 hours No results found for this basename: LIPASE, AMYLASE,  in the last 72 hours CBC:  Recent Labs  07/27/13 1922 07/28/13 0357  WBC 9.2 10.6*  HGB 12.8* 12.4*  HCT 36.2* 35.6*  MCV 83.8 84.4  PLT 265 250   Cardiac Enzymes:  Recent Labs  07/27/13 1640 07/28/13 0357  CKTOTAL 763* 1593*  CKMB 62.6* 108.0*   BNP: No components found with this basename: POCBNP,  D-Dimer: No results  found for this basename: DDIMER,  in the last 72 hours Hemoglobin A1C: No results found for this basename: HGBA1C,  in the last 72 hours Fasting Lipid Panel: No results found for this basename: CHOL, HDL, LDLCALC, TRIG, CHOLHDL, LDLDIRECT,  in the last 72 hours Thyroid Function Tests: No results found for this basename: TSH, T4TOTAL, FREET3, T3FREE, THYROIDAB,  in the last 72 hours Anemia Panel: No results found for this basename: VITAMINB12, FOLATE, FERRITIN, TIBC, IRON, RETICCTPCT,  in the last 72 hours Coag Panel:   No results found for this basename: INR, PROTIME    RADIOLOGY: Dg Chest Port 1 View  07/27/2013   CLINICAL DATA:  Chest pain.  EXAM: PORTABLE CHEST - 1 VIEW  COMPARISON:  03/01/2012.  FINDINGS: Scoliosis. Normal cardiomediastinal silhouette. No focal infiltrate or CHF. Slight retrocardiac subsegmental atelectasis and slight blunting left CP angle. Slight worsening aeration from priors.  IMPRESSION: No active infiltrates or failure. Question mild atelectasis and slight effusion left base.   Electronically Signed   By: Rolla Flatten M.D.   On: 07/27/2013 09:16      ASSESSMENT/PLAN:    1) Acute inferolateral MI: Left to right collaterals to a large PLA.  Distal RCA and  PLA filled with thrombus.  PDA ballooned aggressively.  COntinue IV tirofiban.  Plan for relook cath tomorrow morning.  Aneurysmal RCA.  Explained to patient that I do not know if we will find anything different.  Hopefully, there will be resolution of thrombus.  If not, he may have a chronically occluded distal RCA with collaterals.  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  If the PLA or PDA has disease that may benefit from stenting, this could be considered.  Will discuss with Dr. Tamala Julian before the cath.  2) He is having some arrhythmia as noted above which is likely related to his MI.  Watch in the ICU for now.  This will likely resolve over the  next few days.  Reviewed ECG.  There appears to be some junction rhythm with either retrograde P waves or transient complete heart block.  Likely MI related.  Hematuria: spoke to Dr. Gaynelle Arabian yesterday.  Patient had recent prostate biopsy  Antiplatelet therapy is ok.    U.C.: No recent BM.  I would not be surprised if he had some bloody diarrhea.  Would have to consider BMS if stent needed due to issues with chronic ulcerative colitis.    Labs stable.  Questions about cath answered.  Had some mild right forearm vbleeding.  Good perfusion.  May need to use left arm tomorrow.   Jettie Booze., MD  07/28/2013  10:23 AM

## 2013-07-29 NOTE — Progress Notes (Signed)
Pt had 10 beats nonsustained Vtach at 2238. Pt asymptomatic.  MD on call, Dr. Elias Else, notified. 75mq potassium ordered and a Cmet in the morning.  Will continue to monitor.

## 2013-07-29 NOTE — CV Procedure (Signed)
     Left Heart Catheterization with Coronary Angiography  Report  Dale Meadows  53 y.o.  male 1960/06/08  Procedure Date: 07/29/2013 Referring Physician: Melinda Crutch, M.D. Primary Cardiologist:: Lendell Caprice  INDICATIONS: Recently post PCI of right coronary  PROCEDURE: 1. Left heart catheterization; 2. Left ventriculography; 3. Coronary angiography  CONSENT:  The risks, benefits, and details of the procedure were explained in detail to the patient. Risks including death, stroke, heart attack, kidney injury, allergy, limb ischemia, bleeding and radiation injury were discussed.  The patient verbalized understanding and wanted to proceed.  Informed written consent was obtained.  PROCEDURE TECHNIQUE:  After Xylocaine anesthesia a 5 French sheath was placed in the right femoral artery with the modified Seldinger technique.  Coronary angiography was done using a 5 F A2 multipurpose and JR 4 catheter.  Left ventriculography was done using the 5 French A2 MP catheter and hand injection.   Results were reviewed with Dr. Lendell Caprice. We decided upon medical therapy.  Angio-Seal was used for hemostasis.   CONTRAST:  Total of 90 cc.  COMPLICATIONS:  None   HEMODYNAMICS:  Aortic pressure 78/51 mmHg; LV pressure 78/11 mmHg; LVEDP 17 mm mercury  ANGIOGRAPHIC DATA:   The left main coronary artery is normal.  The left anterior descending artery is proximal one third of the LAD is ectatic. The LAD and diagonals are widely patent..  The left circumflex artery is widely patent. A large first obtuse marginal and small second and third obtuse marginals are normal..  The right coronary artery is ectatic and totally occluded distally with no runoff into the PDA or left ventricular branches. The distal right coronary including a large PDA and several left ventricular branches fills by left to right collaterals. Collateral filling is much improved on today's study compared with 48 hours ago.Marland Kitchen   LEFT  VENTRICULOGRAM:  Left ventricular angiogram was done in the 30 RAO projection and revealed inferior wall akinesis and EF of 40%   IMPRESSIONS:  1. Total occlusion of the distal right coronary. The inferior wall branches fill by left to right collaterals. 2. Widely patent left coronary system 3. Left ventricular dysfunction with estimated ejection fraction of 40% and inferior wall akinesis 4. Hypotension   RECOMMENDATION:  Discontinue Aggrastat IV fluid as tolerated to support blood pressure Further management per Dr.Varanasi.Marland Kitchen

## 2013-07-29 NOTE — Plan of Care (Signed)
Problem: Phase II Progression Outcomes Goal: Discharge plan established Outcome: Completed/Met Date Met:  07/29/13 Home with wife.

## 2013-07-30 DIAGNOSIS — I219 Acute myocardial infarction, unspecified: Secondary | ICD-10-CM

## 2013-07-30 LAB — COMPREHENSIVE METABOLIC PANEL
ALT: 41 U/L (ref 0–53)
AST: 117 U/L — ABNORMAL HIGH (ref 0–37)
Albumin: 2.4 g/dL — ABNORMAL LOW (ref 3.5–5.2)
Alkaline Phosphatase: 57 U/L (ref 39–117)
BUN: 10 mg/dL (ref 6–23)
CALCIUM: 8 mg/dL — AB (ref 8.4–10.5)
CO2: 20 meq/L (ref 19–32)
Chloride: 101 mEq/L (ref 96–112)
Creatinine, Ser: 0.75 mg/dL (ref 0.50–1.35)
GLUCOSE: 100 mg/dL — AB (ref 70–99)
Potassium: 4.1 mEq/L (ref 3.7–5.3)
SODIUM: 135 meq/L — AB (ref 137–147)
TOTAL PROTEIN: 5.7 g/dL — AB (ref 6.0–8.3)
Total Bilirubin: 0.9 mg/dL (ref 0.3–1.2)

## 2013-07-30 MED ORDER — ATORVASTATIN CALCIUM 80 MG PO TABS
80.0000 mg | ORAL_TABLET | Freq: Every day | ORAL | Status: DC
  Administered 2013-07-30 – 2013-07-31 (×2): 80 mg via ORAL
  Filled 2013-07-30 (×3): qty 1

## 2013-07-30 MED ORDER — CLOPIDOGREL BISULFATE 75 MG PO TABS
75.0000 mg | ORAL_TABLET | Freq: Every day | ORAL | Status: DC
  Administered 2013-07-30 – 2013-08-01 (×3): 75 mg via ORAL
  Filled 2013-07-30 (×3): qty 1

## 2013-07-30 NOTE — Progress Notes (Signed)
SUBJECTIVE:  Mild chest discomfort with inspiration; no dyspnea.   OBJECTIVE:   Vitals:   Filed Vitals:   07/29/13 1500 07/29/13 1533 07/29/13 2042 07/30/13 0425  BP:  94/58 93/55 96/49   Pulse: 72 67 69 65  Temp:  99.3 F (37.4 C) 99 F (37.2 C) 97.8 F (36.6 C)  TempSrc:  Oral Oral Oral  Resp: 21 20 20 16   Height:      Weight:      SpO2: 96% 90% 93% 94%   I&O's:    Intake/Output Summary (Last 24 hours) at 07/30/13 0856 Last data filed at 07/30/13 4163  Gross per 24 hour  Intake 1907.92 ml  Output    200 ml  Net 1707.92 ml   TELEMETRY: Reviewed telemetry pt in sinus with first degree AV block; NSVT     PHYSICAL EXAM General: Well developed, well nourished, in no acute distress Head:   Normal  Neck: supple Lungs:   Clear bilaterally to auscultation. Heart:   HRRR  Abdomen: abdomen soft and non-tender; right groin with no hematoma Extremities:   No edema.  3+ right radial pulse, right hand swollen; mild bruising; forearm swollen but soft Neuro: Alert and oriented. Psych:  Normal affect, responds appropriately   LABS: Basic Metabolic Panel:  Recent Labs  07/29/13 0332 07/30/13 0403  NA 134* 135*  K 3.5* 4.1  CL 102 101  CO2 18* 20  GLUCOSE 108* 100*  BUN 12 10  CREATININE 0.78 0.75  CALCIUM 7.9* 8.0*   Liver Function Tests:  Recent Labs  07/30/13 0403  AST 117*  ALT 41  ALKPHOS 57  BILITOT 0.9  PROT 5.7*  ALBUMIN 2.4*   CBC:  Recent Labs  07/28/13 0357 07/29/13 0332  WBC 10.6* 11.6*  HGB 12.4* 11.2*  HCT 35.6* 32.4*  MCV 84.4 83.9  PLT 250 221   Cardiac Enzymes:  Recent Labs  07/27/13 1640 07/28/13 0357 07/29/13 0332  CKTOTAL 763* 1593* 711*  CKMB 62.6* 108.0* 13.2*   Fasting Lipid Panel:  Recent Labs  07/29/13 0332  CHOL 145  HDL 35*  LDLCALC 91  TRIG 94  CHOLHDL 4.1   Coag Panel:   Lab Results  Component Value Date   INR 1.11 07/29/2013    RADIOLOGY: Dg Chest Port 1 View  07/27/2013   CLINICAL DATA:   Chest pain.  EXAM: PORTABLE CHEST - 1 VIEW  COMPARISON:  03/01/2012.  FINDINGS: Scoliosis. Normal cardiomediastinal silhouette. No focal infiltrate or CHF. Slight retrocardiac subsegmental atelectasis and slight blunting left CP angle. Slight worsening aeration from priors.  IMPRESSION: No active infiltrates or failure. Question mild atelectasis and slight effusion left base.   Electronically Signed   By: Rolla Flatten M.D.   On: 07/27/2013 09:16      ASSESSMENT/PLAN:    1) Acute inferolateral MI: Left to right collaterals to a large PLA.  Distal RCA and PLA filled with thrombus.  PDA ballooned aggressively on 4/1.  Despite IV tirofiban,relook cath showed aneurysmal RCA which was occluded distally.  Dr Irish Lack discussed with the patient and his wife that further attempt at intervention would likely result in distal embolization and larger MI due to obstruction of microcirculation.  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 All of their questions were answered and they are satisfied with the plan of  care. Continue ASA and statin (increase lipitor to 80 mg daily); hematuria has improved and no hematochezia. Add plavix. We cannot add ACEI or beta blocker due to hypotesion.  U.C.: No hematochezia.   Ambulate today; possible DC in AM if stable.  Kirk Ruths, MD  07/30/2013  8:56 AM

## 2013-07-30 NOTE — Progress Notes (Signed)
CARDIAC REHAB PHASE I   PRE:  Rate/Rhythm: 66 first degree    BP: lying left 76/40, right 82/56, standing right 92/50    SaO2: 96 RA  MODE:  Ambulation: 550 ft   POST:  Rate/Rhythm: 96 first degreee    BP: sitting 92/50     SaO2:   Pt BP very low in bed. Denied sx with lying or standing. Tolerated walk well. No dizziness. Slight SOB but sts its not bad. No CP. To recliner. Ed completed with pt and wife. Voiced good understanding and requests his name be sent to [place]. [phone]   Dale Meadows CES, ACSM 07/30/2013 9:59 AM

## 2013-07-31 LAB — URINALYSIS, ROUTINE W REFLEX MICROSCOPIC
BILIRUBIN URINE: NEGATIVE
Glucose, UA: NEGATIVE mg/dL
Ketones, ur: 15 mg/dL — AB
LEUKOCYTES UA: NEGATIVE
NITRITE: NEGATIVE
PH: 7 (ref 5.0–8.0)
Protein, ur: NEGATIVE mg/dL
SPECIFIC GRAVITY, URINE: 1.022 (ref 1.005–1.030)
UROBILINOGEN UA: 1 mg/dL (ref 0.0–1.0)

## 2013-07-31 LAB — URINE MICROSCOPIC-ADD ON

## 2013-07-31 NOTE — Progress Notes (Signed)
SUBJECTIVE:  No chest pain or dyspnea; no productive cough, diarrhea or dysuria   OBJECTIVE:   Vitals:   Filed Vitals:   07/30/13 2038 07/31/13 0008 07/31/13 0452 07/31/13 0500  BP: 101/58  90/52 87/48  Pulse: 68   70  Temp: 99.9 F (37.7 C) 99.5 F (37.5 C)  98.8 F (37.1 C)  TempSrc: Oral     Resp: 18   16  Height:      Weight:      SpO2: 97%   92%   I&O's:    Intake/Output Summary (Last 24 hours) at 07/31/13 0847 Last data filed at 07/31/13 0520  Gross per 24 hour  Intake    440 ml  Output      0 ml  Net    440 ml   TELEMETRY: Reviewed telemetry pt in sinus with occasional Mobitz 1  PHYSICAL EXAM General: Well developed, well nourished, in no acute distress Head:   Normal  Neck: supple Lungs:   Clear bilaterally to auscultation. Heart:   HRRR  Abdomen: abdomen soft and non-tender; right groin with no hematoma Extremities:   No edema.  2+ right radial pulse, right hand swollen; mild bruising; forearm swollen but soft Neuro: Alert and oriented. Psych:  Normal affect, responds appropriately   LABS: Basic Metabolic Panel:  Recent Labs  07/29/13 0332 07/30/13 0403  NA 134* 135*  K 3.5* 4.1  CL 102 101  CO2 18* 20  GLUCOSE 108* 100*  BUN 12 10  CREATININE 0.78 0.75  CALCIUM 7.9* 8.0*   Liver Function Tests:  Recent Labs  07/30/13 0403  AST 117*  ALT 41  ALKPHOS 57  BILITOT 0.9  PROT 5.7*  ALBUMIN 2.4*   CBC:  Recent Labs  07/29/13 0332  WBC 11.6*  HGB 11.2*  HCT 32.4*  MCV 83.9  PLT 221   Cardiac Enzymes:  Recent Labs  07/29/13 0332  CKTOTAL 711*  CKMB 13.2*   Fasting Lipid Panel:  Recent Labs  07/29/13 0332  CHOL 145  HDL 35*  LDLCALC 91  TRIG 94  CHOLHDL 4.1   Coag Panel:   Lab Results  Component Value Date   INR 1.11 07/29/2013    RADIOLOGY: Dg Chest Port 1 View  07/27/2013   CLINICAL DATA:  Chest pain.  EXAM: PORTABLE CHEST - 1 VIEW  COMPARISON:  03/01/2012.  FINDINGS: Scoliosis. Normal cardiomediastinal  silhouette. No focal infiltrate or CHF. Slight retrocardiac subsegmental atelectasis and slight blunting left CP angle. Slight worsening aeration from priors.  IMPRESSION: No active infiltrates or failure. Question mild atelectasis and slight effusion left base.   Electronically Signed   By: Rolla Flatten M.D.   On: 07/27/2013 09:16      ASSESSMENT/PLAN:    1) Acute inferolateral MI: Left to right collaterals to a large PLA.  Distal RCA and PLA filled with thrombus.  PDA ballooned aggressively on 4/1.  Despite IV tirofiban, relook cath showed aneurysmal RCA which was occluded distally.  Dr Irish Lack discussed with the patient and his wife that further attempt at intervention would likely result in distal embolization and larger MI due to obstruction of microcirculation.  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 Continue ASA, plavix and statin; not hematuria has improved and no hematochezia. We cannot add ACEI or beta blocker due to  hypotesion. Patient noted to have intermittent Mobitz 1 on telemetry. Follow.  2)U.C.: No hematochezia.  3) Low grade fever: 99.9; no localizing signs or symptoms of infection; check UA.   Ambulate today; possible DC in AM if stable.  Kirk Ruths, MD  07/31/2013  8:47 AM

## 2013-08-01 DIAGNOSIS — C61 Malignant neoplasm of prostate: Secondary | ICD-10-CM

## 2013-08-01 DIAGNOSIS — R569 Unspecified convulsions: Secondary | ICD-10-CM | POA: Diagnosis present

## 2013-08-01 LAB — BASIC METABOLIC PANEL
BUN: 9 mg/dL (ref 6–23)
CALCIUM: 8.3 mg/dL — AB (ref 8.4–10.5)
CO2: 22 mEq/L (ref 19–32)
CREATININE: 0.85 mg/dL (ref 0.50–1.35)
Chloride: 100 mEq/L (ref 96–112)
GFR calc Af Amer: >90 mL/min (ref >90)
Glucose, Bld: 112 mg/dL — ABNORMAL HIGH (ref 70–99)
Potassium: 3.3 mEq/L — ABNORMAL LOW (ref 3.7–5.3)
Sodium: 137 mEq/L (ref 137–147)

## 2013-08-01 LAB — CBC
HCT: 31.4 % — ABNORMAL LOW (ref 39.0–52.0)
Hemoglobin: 10.9 g/dL — ABNORMAL LOW (ref 13.0–17.0)
MCH: 29 pg (ref 26.0–34.0)
MCHC: 34.7 g/dL (ref 30.0–36.0)
MCV: 83.5 fL (ref 78.0–100.0)
PLATELETS: 304 10*3/uL (ref 150–400)
RBC: 3.76 MIL/uL — ABNORMAL LOW (ref 4.22–5.81)
RDW: 12.4 % (ref 11.5–15.5)
WBC: 7.6 10*3/uL (ref 4.0–10.5)

## 2013-08-01 MED ORDER — NITROGLYCERIN 0.4 MG SL SUBL: 0.4000 mg | SUBLINGUAL_TABLET | SUBLINGUAL | Status: DC | PRN

## 2013-08-01 MED ORDER — ASPIRIN EC 81 MG PO TBEC: 81.0000 mg | DELAYED_RELEASE_TABLET | Freq: Every day | ORAL | Status: DC

## 2013-08-01 MED ORDER — CLOPIDOGREL BISULFATE 75 MG PO TABS: 75.0000 mg | ORAL_TABLET | Freq: Every day | ORAL | Status: DC

## 2013-08-01 MED ORDER — ATORVASTATIN CALCIUM 80 MG PO TABS: 80.0000 mg | ORAL_TABLET | Freq: Every day | ORAL | Status: DC

## 2013-08-01 NOTE — Progress Notes (Signed)
0935 Came to offer to walk with pt. He stated he walked 6 minutes this morning without CP. Stated no questions re MI ed. Hopes to go home. We will sign off since walking independently, ed completed and referring to CRP 2. Graylon Good RN BSN 08/01/2013 9:37 AM

## 2013-08-01 NOTE — Progress Notes (Signed)
Patient Name: Dale Meadows Date of Encounter: 08/01/2013     Principal Problem:   STEMI (ST elevation myocardial infarction), inferior Active Problems:   Ulcerative colitis   HLD (hyperlipidemia)   Acute myocardial infarction of other inferior wall, initial episode of care   Hematuria   Junctional bradycardia    SUBJECTIVE  Feeling good. Wants to go home. He has not had a BM, but the last one two days ago was without blood.   CURRENT MEDS . aspirin  81 mg Oral Daily  . atorvastatin  80 mg Oral q1800  . clopidogrel  75 mg Oral Q breakfast    OBJECTIVE  Filed Vitals:   07/31/13 1400 07/31/13 1654 07/31/13 2100 08/01/13 0500  BP: 91/49  91/49 92/54  Pulse: 71  70 66  Temp: 99.8 F (37.7 C) 98.5 F (36.9 C) 98.2 F (36.8 C) 98.6 F (37 C)  TempSrc: Oral Oral    Resp: 18  16 18   Height:      Weight:      SpO2: 92%  93% 95%    Intake/Output Summary (Last 24 hours) at 08/01/13 0823 Last data filed at 07/31/13 1700  Gross per 24 hour  Intake    480 ml  Output      0 ml  Net    480 ml   Filed Weights   07/27/13 0904 07/27/13 1105 07/29/13 0400  Weight: 194 lb 0.1 oz (88 kg) 195 lb 8.8 oz (88.7 kg) 197 lb 15.6 oz (89.8 kg)    PHYSICAL EXAM  General: Pleasant, NAD. Neuro: Alert and oriented X 3. Moves all extremities spontaneously. Psych: Normal affect. HEENT:  Normal  Neck: Supple without bruits or JVD. Lungs:  Resp regular and unlabored, CTA. Heart: RRR no s3, s4, or murmurs. Abdomen: Soft, non-tender, non-distended, BS + x 4.  Extremities: No clubbing, cyanosis or edema. DP/PT/Radials 2+ and equal bilaterally. Right UE bruise and radial cath site intact. Fem cath site intact as well.   Accessory Clinical Findings  CBC  Basic Metabolic Panel  Recent Labs  07/30/13 0403  NA 135*  K 4.1  CL 101  CO2 20  GLUCOSE 100*  BUN 10  CREATININE 0.75  CALCIUM 8.0*   Liver Function Tests  Recent Labs  07/30/13 0403  AST 117*  ALT 41    ALKPHOS 57  BILITOT 0.9  PROT 5.7*  ALBUMIN 2.4*    TELE  NSR first deg AV block  Radiology/Studies  Dg Chest Port 1 View  07/27/2013   CLINICAL DATA:  Chest pain.  EXAM: PORTABLE CHEST - 1 VIEW  COMPARISON:  03/01/2012.  FINDINGS: Scoliosis. Normal cardiomediastinal silhouette. No focal infiltrate or CHF. Slight retrocardiac subsegmental atelectasis and slight blunting left CP angle. Slight worsening aeration from priors.  IMPRESSION: No active infiltrates or failure. Question mild atelectasis and slight effusion left base.    ASSESSMENT AND PLAN Dale Meadows is a 53 y.o. male with a history of ulcerative colitis, with recent hematochezia, HLD, prostate cancer and seizure (last age 12) who presented to [facility] on 07/27/13 with interior STEMI and was taken for emergent cath.   1) Acute inferolateral MI: Left to right collaterals to a large PLA. Distal RCA and PLA filled with thrombus. PDA ballooned aggressively on 4/1. 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 Discussed with the patient and his wife that further attempt at intervention would likely result in distal embolization and larger MI 2  to obstruction of micro circulation. 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 Long term, he may not tolerate dual antiplatelet therapy due to his ulcerative colitis.  -- Continue ASA/Plavix and statin. Blood pressure will not tolerate a BB.   2) LV dysfunction- EF ~40% and inferior wall akinesis by cardiac cath. BP will not tolerate an ACE currently. Will need repeat ECHO as an outpatient to monitor LV recovery.   3) Dysrhythmias- He was monitored in the ICU and noted to have some junctional rhythm with either retrograde P waves or transient complete heart block which were thought to be likely  MI related. No further issues like this. In NSR with first degree AV block currently.  4) Hematuria: Dr. Irish Lack spoke to Dr. Gaynelle Arabian who okayed the use of antiplatelet therapy. Patient had recent prostate biopsy and is being followed for this.   5) Ulcerative Colitis: No recent BM. Last one two days ago which was clear. On anucort and Delzicol and is followed closely by GI.   Tyrell Antonio PA-C  Pager [phone]  Agree with note by Lorretta Harp PA-C. Pt with STEMI secondary to a thrombotically occluded RCA that was aneurysmal. The decision by Dr. Irish Lack was medical Rx. He had a nl left system with an EF of 40% as well as UC. He has been asymptomatic with relatively low BP precluding Rx with BB/ACE-I. OK for D/C home. ROV with Dr. Irish Lack. Will need repeat 2D in several months to reassess LV function.  Lorretta Harp, M.D., [place], [facility], [place] [address] [place], [place]  [address]  [phone] 08/01/2013 1:13 PM

## 2013-08-01 NOTE — Discharge Instructions (Signed)
Groin Site Care Refer to this sheet in the next few weeks. These instructions provide you with information on caring for yourself after your procedure. Your caregiver may also give you more specific instructions. Your treatment has been planned according to current medical practices, but problems sometimes occur. Call your caregiver if you have any problems or questions after your procedure. HOME CARE INSTRUCTIONS  You may shower 24 hours after the procedure. Remove the bandage (dressing) and gently wash the site with plain soap and water. Gently pat the site dry.   Do not apply powder or lotion to the site.   Do not sit in a bathtub, swimming pool, or whirlpool for 5 to 7 days.   No bending, squatting, or lifting anything over 10 pounds (4.5 kg) as directed by your caregiver.   Inspect the site at least twice daily.   Do not drive home if you are discharged the same day of the procedure. Have someone else drive you.   You may drive 24 hours after the procedure unless otherwise instructed by your caregiver.  What to expect:  Any bruising will usually fade within 1 to 2 weeks.   Blood that collects in the tissue (hematoma) may be painful to the touch. It should usually decrease in size and tenderness within 1 to 2 weeks.  SEEK IMMEDIATE MEDICAL CARE IF:  You have unusual pain at the groin site or down the affected leg.   You have redness, warmth, swelling, or pain at the groin site.   You have drainage (other than a small amount of blood on the dressing).   You have chills.   You have a fever or persistent symptoms for more than 72 hours.   You have a fever and your symptoms suddenly get worse.   Your leg becomes pale, cool, tingly, or numb.  You have heavy bleeding from the site. Hold pressure on the site. .   Radial Site Care Refer to this sheet in the next few weeks. These instructions provide you with information on caring for yourself after your procedure. Your caregiver  may also give you more specific instructions. Your treatment has been planned according to current medical practices, but problems sometimes occur. Call your caregiver if you have any problems or questions after your procedure. HOME CARE INSTRUCTIONS  You may shower the day after the procedure.Remove the bandage (dressing) and gently wash the site with plain soap and water.Gently pat the site dry.   Do not apply powder or lotion to the site.   Do not submerge the affected site in water for 3 to 5 days.   Inspect the site at least twice daily.   Do not flex or bend the affected arm for 24 hours.   No lifting over 5 pounds (2.3 kg) for 5 days after your procedure.   Do not drive home if you are discharged the same day of the procedure. Have someone else drive you.   You may drive 24 hours after the procedure unless otherwise instructed by your caregiver.  What to expect:  Any bruising will usually fade within 1 to 2 weeks.   Blood that collects in the tissue (hematoma) may be painful to the touch. It should usually decrease in size and tenderness within 1 to 2 weeks.  SEEK IMMEDIATE MEDICAL CARE IF:  You have unusual pain at the radial site.   You have redness, warmth, swelling, or pain at the radial site.   You have drainage (other  than a small amount of blood on the dressing).   You have chills.   You have a fever or persistent symptoms for more than 72 hours.   You have a fever and your symptoms suddenly get worse.   Your arm becomes pale, cool, tingly, or numb.   You have heavy bleeding from the site. Hold pressure on the site.

## 2013-08-01 NOTE — Discharge Summary (Signed)
Discharge Summary   Patient ID: Dale Meadows MRN: [id number], DOB/AGE: December 24, 1960 53 y.o. Admit date: 07/27/2013 D/C date:     08/01/2013  Primary Cardiologist: Dr. Irish Lack  Principal Problem:   STEMI (ST elevation myocardial infarction), inferior Active Problems:   Ulcerative colitis   HLD (hyperlipidemia)   Hematuria   Seizures   Prostate cancer   Admission Dates: 07/27/13 - 08/01/13 Discharge Diagnosis: inferior STEMI s/p aspiration thrombectomy of the RCA and PTCA of the RCA/PDA on this admission   HPI: Dale Meadows is a 53 y.o. male with a history of ulcerative colitis- with recent hematochezia, HLD, prostate cancer and remote seizures who presented to [facility] on 07/27/13 with interior STEMI and was taken for emergent cath.   1) Acute inferolateral MI: Left to right collaterals to a large PLA. Distal RCA and PLA filled with thrombus. PDA ballooned aggressively on 07/27/13. 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 It was discussed with the patient that further attempts at intervention would likely result in distal embolization and larger MI secondary to obstruction of micro circulation. 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 Long term, he may not tolerate dual antiplatelet therapy due to his ulcerative colitis. However, he will continue ASA/Plavix for now as well as a statin. Blood pressure will not tolerate a BB.   2) LV dysfunction- EF~40% and inferior wall akinesis by cardiac cath. BP will not tolerate an ACE or BB currently. Will need repeat ECHO in 3 months as an outpatient to monitor LV recovery.   3) Dysrhythmias- He was monitored in the ICU and noted to have some NSVT as well as a junctional rhythm with either retrograde P waves or transient complete heart block. These were thought to be likely MI  related and have since resolved. There were no further issues and he was transferred to telemetry where he has remained in NSR with first degree AV block.   4) Hematuria: He was noted to have hematuria during his admission; however, this is chronic for him. Dr. Irish Lack spoke to Dr. Gaynelle Arabian who okayed the use of antiplatelet therapy.   5) Ulcerative Colitis: No recent BM. Last one two days before discharge which was clear of blood. On Anucort and Delzicol and is followed closely by GI.  The patient is recovering well and has been seen by Dr. Gwenlyn Found today and deemed stable for discharge home. The radial and femoral catheter sites are healing well.  All follow-up appointments have been scheduled.  Discharge medications include ASA/Plavix and Lipitor 49m. He may not  tolerate dual antiplatelet therapy long term due to his ulcerative colitis. His BP will not allow the addition of an ACE or BB at this time. This should be re-evaluated as an outpatient.    Discharge Vitals: Blood pressure 92/54, pulse 66, temperature 98.6 F (37 C), temperature source Oral, resp. rate 18, height 6' 1"  (1.854 m), weight 197 lb 15.6 oz (89.8 kg), SpO2 95.00%.  Labs: Lab Results  Component Value Date   WBC 7.6 08/01/2013   HGB 10.9* 08/01/2013   HCT 31.4* 08/01/2013   MCV 83.5 08/01/2013   PLT 304 08/01/2013     Recent Labs Lab 07/30/13 0403 08/01/13 1005  NA 135* 137  K 4.1 3.3*  CL 101 100  CO2 20 22  BUN 10 9  CREATININE  0.75 0.85  CALCIUM 8.0* 8.3*  PROT 5.7*  --   BILITOT 0.9  --   ALKPHOS 57  --   ALT 41  --   AST 117*  --   GLUCOSE 100* 112*    Lab Results  Component Value Date   CHOL 145 07/29/2013   HDL 35* 07/29/2013   LDLCALC 91 07/29/2013   TRIG 94 07/29/2013     Diagnostic Studies/Procedures   Dg Chest Port 1 View  07/27/2013   CLINICAL DATA:  Chest pain.  EXAM: PORTABLE CHEST - 1 VIEW  COMPARISON:  03/01/2012.  FINDINGS: Scoliosis. Normal cardiomediastinal silhouette. No focal infiltrate or  CHF. Slight retrocardiac subsegmental atelectasis and slight blunting left CP angle. Slight worsening aeration from priors.  IMPRESSION: No active infiltrates or failure. Question mild atelectasis and slight effusion left base.      Left Heart Catheterization with Coronary Angiography Report  Procedure Date: 07/29/2013  Referring Physician: Melinda Crutch, M.D.  Primary Cardiologist:: Lendell Caprice  INDICATIONS: Recently post PCI of right coronary  PROCEDURE: 1. Left heart catheterization; 2. Left ventriculography; 3. Coronary angiography  CONSENT:  The risks, benefits, and details of the procedure were explained in detail to the patient. Risks including death, stroke, heart attack, kidney injury, allergy, limb ischemia, bleeding and radiation injury were discussed. The patient verbalized understanding and wanted to proceed. Informed written consent was obtained.  PROCEDURE TECHNIQUE: After Xylocaine anesthesia a 5 French sheath was placed in the right femoral artery with the modified Seldinger technique. Coronary angiography was done using a 5 F A2 multipurpose and JR 4 catheter. Left ventriculography was done using the 5 French A2 MP catheter and hand injection.  Results were reviewed with Dr. Lendell Caprice. We decided upon medical therapy.  Angio-Seal was used for hemostasis.  CONTRAST: Total of 90 cc.  COMPLICATIONS: None  HEMODYNAMICS: Aortic pressure 78/51 mmHg; LV pressure 78/11 mmHg; LVEDP 17 mm mercury  ANGIOGRAPHIC DATA: The left main coronary artery is normal.  The left anterior descending artery is proximal one third of the LAD is ectatic. The LAD and diagonals are widely patent..  The left circumflex artery is widely patent. A large first obtuse marginal and small second and third obtuse marginals are normal..  The right coronary artery is ectatic and totally occluded distally with no runoff into the PDA or left ventricular branches. The distal right coronary including a large PDA and several  left ventricular branches fills by left to right collaterals. Collateral filling is much improved on today's study compared with 48 hours ago.Marland Kitchen  LEFT VENTRICULOGRAM: Left ventricular angiogram was done in the 30 RAO projection and revealed inferior wall akinesis and EF of 40%  IMPRESSIONS: 1. Total occlusion of the distal right coronary. The inferior wall branches fill by left to right collaterals.  2. Widely patent left coronary system  3. Left ventricular dysfunction with estimated ejection fraction of 40% and inferior wall akinesis  4. Hypotension  RECOMMENDATION: Discontinue Aggrastat  IV fluid as tolerated to support blood pressure  Further management per Dr.Varanasi.Marland Kitchen       Left heart cath 07/27/13  PROCEDURE: Left heart catheterization with selective coronary angiography, left ventriculogram. Aspiration thrombectomy of the RCA. PTCA of the RCA/PDA  INDICATIONS: Inferior STEMI  The risks, benefits, and details of the procedure were explained to the patient. The patient verbalized understanding and wanted to proceed. Emergency consent.  PROCEDURE TECHNIQUE: After Xylocaine anesthesia a 72F slender sheath was placed in the right radial artery with  a single anterior needle wall stick. IV heparin was given. Right coronary angiography was done using a Judkins R4 guide catheter. Left coronary angiography was done using a Judkins L3.5 guide catheter. Left ventriculography was done using a pigtail catheter. IV tirofiban and heparin were used for anticoagulation. An ACT was used to check that these were therapeutic. A TR band was used for hemostasis.  CONTRAST: Total of 230 cc.  COMPLICATIONS: None.  HEMODYNAMICS: Aortic pressure was 95/63; LV pressure was 94/7; LVEDP 18. There was no gradient between the left ventricle and aorta.  ANGIOGRAPHIC DATA: The left main coronary artery is widely patenty.  The left anterior descending artery is a large vessel proximally. There is mild ectasia in the  proximal vessel. There is a large first diagonal which is widely patent. The remainder of the mid to distal LAD is widely patent.  The left circumflex artery is a large vessel proximally. There is a large first obtuse marginal which is widely patent and branches across the lateral wall. The remainder of the circumflex is medium-sized with several branches which are all patent.  The right coronary artery is a large vessel proximally. There is severe ectasia in the proximal, mid and distal portion of the vessel. At the distal RCA, there is a large thrombus burden. This is likely occurring just at the bifurcation of the posterior lateral artery and the posterior descending artery. We cannot exactly see where the posterior lateral artery originates do to an apparent flush occlusion. In the posterior descending artery, there is a 99% thrombotic lesion.  LEFT VENTRICULOGRAM: Left ventricular angiogram was done in the 30 RAO projection and revealed basal to mid hypokinesis of the inferior wall and systolic function with an estimated ejection fraction of 50%. LVEDP was 18 mmHg.  PCI NARRATIVE: A JR 4 Guiding catheter was used. A Prowater wire was placed across the thrombus in the distal RCA and PDA. An aspiration catheter was advanced and thrombectomy was performed. When removing aspiration catheter, or unable to maintain wire position. It appeared after everything was out of the body that the wire was wrapped around the catheter. We then put another pro-water wire down. A 2.5 x 12 balloon was used to predilate the lesion. There was no significant change. There is difficulty with guide catheter support. We tried aspiration with a different catheter. This did not navigate the bend at the distal RCA.  We then switched to an AL-1 guide catheter. We placed a Fielder XT wire down into the PDA. Intra-arterial tirofiban was given, 10 cc from the bag. A 3.0 and a 3.5 balloon were used to dilate the diseased area. There is  clearly a large area of thrombus in the distal RCA remaining. This is likely obstructing the origin of the posterior lateral artery. The patient did have left to right collaterals. At that point, the patient's symptoms were significantly improved. He was hemodynamically stable. My concern was that if we continue to try to manipulate into the posterior lateral artery, we may embolize thrombus and cause microvascular obstruction and thus infarction. At least at this point, he has good collateral flow. We will anticoagulate and then bring him back for relook in a few days.  IMPRESSIONS:  1. Normal left main coronary artery. 2. Widely patent left anterior descending artery and its branches. Mild ectasia in the proximal LAD. 3. Widely patent left circumflex artery and its branches. 4. Aneurysmal right coronary artery. Large thrombus burden in the distal RCA likely at the bifurcation  of the posterior lateral artery and posterior descending artery. The distal RCA and posterior descending artery were successfully balloon. There is a 20% residual stenosis in the PDA. There is TIMI 3 flow. The posterior lateral artery is occluded with a large thrombus burden in the distal RCA. 5. Normal left ventricular systolic function. LVEDP 18 mmHg. Ejection fraction 50 %. RECOMMENDATION: Plan for 24-48 hours of GP IIb IIIa inhibitor. Hopefully this will help with the residual thrombus burden. We'll plan on re cath on either Thursday or Friday. Medical therapy for now including statin and prasugrel. His heart rate will not tolerate a beta blocker at this time.       Discharge Medications     Medication List         ANUCORT-HC 25 MG suppository  Generic drug:  hydrocortisone  Place 25 mg rectally at bedtime.     aspirin EC 81 MG tablet  Take 1 tablet (81 mg total) by mouth daily.     atorvastatin 80 MG tablet  Commonly known as:  LIPITOR  Take 1 tablet (80 mg total) by mouth daily at 6 PM.     clopidogrel 75 MG  tablet  Commonly known as:  PLAVIX  Take 1 tablet (75 mg total) by mouth daily with breakfast.     DELZICOL 400 MG Cpdr DR capsule  Generic drug:  Mesalamine  Take 1,200 mg by mouth 2 (two) times daily.     multivitamin with minerals tablet  Take 1 tablet by mouth daily.     nitroGLYCERIN 0.4 MG SL tablet  Commonly known as:  NITROSTAT  Place 1 tablet (0.4 mg total) under the tongue every 5 (five) minutes as needed for chest pain.     PRO-BIOTIC BLEND PO  Take 1 tablet by mouth daily.        Disposition   The patient will be discharged in stable condition to home. Discharge Orders   Future Appointments Provider Department Dept Phone   08/09/2013 9:30 AM Liliane Shi, PA-C [facility] [phone]   Future Orders Complete By Expires   Amb Referral to Cardiac Rehabilitation  As directed      Follow-up Information   Follow up with Richardson Dopp, PA-C On 08/09/2013. (@9 :30)    Specialty:  Physician Assistant   Contact information:   [address] [address] [phone]         Duration of Discharge Encounter: Greater than 30 minutes including physician and PA time.  SignedVertell Limber, Noa Constante PA-C 08/01/2013, 1:22 PM

## 2013-08-01 NOTE — Progress Notes (Signed)
D/c orders received;IV removed with gauze on, pt remains in stable condition, pt meds and instructions received and given to pt; pt d/c to home

## 2013-08-09 ENCOUNTER — Encounter: Payer: 59 | Admitting: Physician Assistant

## 2013-08-10 ENCOUNTER — Encounter: Payer: Self-pay | Admitting: Interventional Cardiology

## 2013-08-10 ENCOUNTER — Ambulatory Visit (INDEPENDENT_AMBULATORY_CARE_PROVIDER_SITE_OTHER): Payer: 59 | Admitting: Interventional Cardiology

## 2013-08-10 VITALS — BP 101/72 | HR 91 | Ht 72.0 in | Wt 185.1 lb

## 2013-08-10 DIAGNOSIS — I219 Acute myocardial infarction, unspecified: Secondary | ICD-10-CM | POA: Insufficient documentation

## 2013-08-10 DIAGNOSIS — I498 Other specified cardiac arrhythmias: Secondary | ICD-10-CM

## 2013-08-10 DIAGNOSIS — K5190 Ulcerative colitis, unspecified, without complications: Secondary | ICD-10-CM

## 2013-08-10 DIAGNOSIS — E785 Hyperlipidemia, unspecified: Secondary | ICD-10-CM

## 2013-08-10 DIAGNOSIS — I2119 ST elevation (STEMI) myocardial infarction involving other coronary artery of inferior wall: Secondary | ICD-10-CM

## 2013-08-10 DIAGNOSIS — R001 Bradycardia, unspecified: Secondary | ICD-10-CM

## 2013-08-10 MED ORDER — ATORVASTATIN CALCIUM 80 MG PO TABS: 40.0000 mg | ORAL_TABLET | Freq: Every day | ORAL | Status: DC

## 2013-08-10 NOTE — Patient Instructions (Signed)
Your physician has recommended you make the following change in your medication:   1. Decrease Atorvastatin 80 mg to 1/2 tablet daily.   Your physician recommends that you return for a FASTING lipid profile in 3 months on 11/09/13.  Your physician wants you to follow-up in: 3 months with Dr. Irish Lack. You will receive a reminder letter in the mail two months in advance. If you don't receive a letter, please call our office to schedule the follow-up appointment.

## 2013-08-10 NOTE — Progress Notes (Signed)
Patient ID: [name], male   DOB: 1960-08-29, 53 y.o.   MRN: [id number]    [place] [place], [place], [place]  [address] Phone: [phone] Fax:  [phone]  Date:  08/10/2013   ID:  [name], DOB 28-Feb-1961, MRN [id number]  PCP:  No primary provider on file.      History of Present Illness: Eoghan Belcher is a 52 y.o. male who has a h/o UC.  He had an inferior MI which could not be treated with a stent due to a aneurysal, large RCA.  During initial cardiac catheterization, his distal RCA and posterior descending artery are treated with balloon angioplasty. 48 hours later, after treatment with tirofiban, he had a repeat angiogram which showed an occluded distal RCA. There were brisk left to right collaterals. He has not had anginal type sx.  his ulcerative colitis has become less of an issue. He has not had any bleeding issues on dual antiplatelet therapy. He denies any lightheadedness or syncope. He has not felt any palpitations.   Wt Readings from Last 3 Encounters:  08/10/13 185 lb 1.9 oz (83.97 kg)  07/29/13 197 lb 15.6 oz (89.8 kg)  07/29/13 197 lb 15.6 oz (89.8 kg)     Past Medical History  Diagnosis Date  . Seizures     last seizure 26 years ago  . Cancer 05/29/2013    prostate  . Ulcerative colitis   . Elevated cholesterol   . Hematuria   . Junctional bradycardia     Current Outpatient Prescriptions  Medication Sig Dispense Refill  . ANUCORT-HC 25 MG suppository Place 25 mg rectally at bedtime.      Marland Kitchen aspirin EC 81 MG tablet Take 1 tablet (81 mg total) by mouth daily.      Marland Kitchen atorvastatin (LIPITOR) 80 MG tablet Take 0.5 tablets (40 mg total) by mouth daily at 6 PM.  30 tablet  11  . clopidogrel (PLAVIX) 75 MG tablet Take 1 tablet (75 mg total) by mouth daily with breakfast.  30 tablet  11  . DELZICOL 400 MG CPDR DR capsule Take 1,200 mg by mouth 2 (two) times daily.       . Multiple Vitamins-Minerals (MULTIVITAMIN WITH MINERALS) tablet Take 1  tablet by mouth daily.      . nitroGLYCERIN (NITROSTAT) 0.4 MG SL tablet Place 1 tablet (0.4 mg total) under the tongue every 5 (five) minutes as needed for chest pain.  25 tablet  12  . Probiotic Product (PRO-BIOTIC BLEND PO) Take 1 tablet by mouth daily.       No current facility-administered medications for this visit.    Allergies:   No Known Allergies  Social History:  The patient  reports that he quit smoking about 5 years ago. His smoking use included Cigars. He has never used smokeless tobacco. He reports that he drinks alcohol. He reports that he does not use illicit drugs.   Family History:  The patient's family history is negative for Heart disease.   ROS:  Please see the history of present illness.  No nausea, vomiting.  No fevers, chills.  No focal weakness.  No dysuria.    All other systems reviewed and negative.   PHYSICAL EXAM: VS:  BP 101/72  Pulse 91  Ht 6' (1.829 m)  Wt 185 lb 1.9 oz (83.97 kg)  BMI 25.10 kg/m2 Well nourished, well developed, in no acute distress HEENT: normal Neck: no JVD, no carotid bruits Cardiac:  normal  S1, S2; RRR;  Lungs:  clear to auscultation bilaterally, no wheezing, rhonchi or rales Abd: soft, nontender, no hepatomegaly Ext: no edema, Bruised right forearm, 2+ right radial pulse Skin: warm and dry Neuro:   no focal abnormalities noted      ASSESSMENT AND PLAN:  1. Status post acute inferolateral ST elevation MI: He was treated with angioplasty but this resulted in the last. He is now being treated medically. He has no angina. Continue aggressive medical therapy. It appears that his bradycardia has resolved.  Could consider initiating low-dose beta blocker. However, his blood pressure is a little on the low side. We'll hold off for now and see how his blood pressure does. 2. LDL was less than 100 in the hospital. We'll decrease his atorvastatin to 40 mg daily. Recheck lipids in 3 months. 3. Continue aspirin and clopidogrel for now. If  he has any hematochezia, we could reconsider. We did discuss the anti-inflammatory effects of these cardiac medications on the vasculature. 4. He will this being cardiac rehabilitation. I encouraged him to increase his activity as tolerated.  Signed, Mina Marble, MD, [facility] 08/10/2013 2:36 PM

## 2013-08-11 ENCOUNTER — Encounter: Payer: 59 | Admitting: Interventional Cardiology

## 2013-08-15 ENCOUNTER — Telehealth: Payer: Self-pay | Admitting: Interventional Cardiology

## 2013-08-15 NOTE — Telephone Encounter (Signed)
New problem   Pt need to speak to you concerning changing some information on the return to work form. Please call pt

## 2013-08-15 NOTE — Telephone Encounter (Signed)
Spoke with pt and let him know I have received form. Dr. Irish Lack will not be back in the office until next week. I will speak with Richardson Dopp tomorrow as pt states he seen scott in the hospital.

## 2013-08-16 ENCOUNTER — Encounter: Payer: 59 | Admitting: Physician Assistant

## 2013-08-17 ENCOUNTER — Telehealth: Payer: Self-pay | Admitting: Interventional Cardiology

## 2013-08-17 NOTE — Telephone Encounter (Signed)
New message    Returning Amy's call

## 2013-08-18 ENCOUNTER — Telehealth: Payer: Self-pay | Admitting: Interventional Cardiology

## 2013-08-18 NOTE — Telephone Encounter (Signed)
Called pt back to let him know we are still working on getting another cardiologist to fill out form since Dr. Irish Lack is out of the office until next week.

## 2013-08-18 NOTE — Telephone Encounter (Signed)
New message          Pt just following up to see if you were successful in getting the medical release from dr Tamala Julian.

## 2013-11-09 ENCOUNTER — Other Ambulatory Visit (INDEPENDENT_AMBULATORY_CARE_PROVIDER_SITE_OTHER): Payer: 59

## 2013-11-09 DIAGNOSIS — E785 Hyperlipidemia, unspecified: Secondary | ICD-10-CM

## 2013-11-09 LAB — HEPATIC FUNCTION PANEL
ALK PHOS: 75 U/L (ref 39–117)
ALT: 32 U/L (ref 0–53)
AST: 24 U/L (ref 0–37)
Albumin: 4 g/dL (ref 3.5–5.2)
BILIRUBIN DIRECT: 0 mg/dL (ref 0.0–0.3)
BILIRUBIN TOTAL: 0.6 mg/dL (ref 0.2–1.2)
Total Protein: 7.4 g/dL (ref 6.0–8.3)

## 2013-11-09 LAB — LIPID PANEL
CHOLESTEROL: 130 mg/dL (ref 0–200)
HDL: 36 mg/dL — ABNORMAL LOW (ref >39.00)
LDL Cholesterol: 64 mg/dL (ref 0–99)
NONHDL: 94
Total CHOL/HDL Ratio: 4
Triglycerides: 152 mg/dL — ABNORMAL HIGH (ref 0.0–149.0)
VLDL: 30.4 mg/dL (ref 0.0–40.0)

## 2013-11-10 ENCOUNTER — Other Ambulatory Visit: Payer: Self-pay | Admitting: Cardiology

## 2013-11-10 DIAGNOSIS — E785 Hyperlipidemia, unspecified: Secondary | ICD-10-CM

## 2013-12-29 ENCOUNTER — Ambulatory Visit (INDEPENDENT_AMBULATORY_CARE_PROVIDER_SITE_OTHER): Payer: 59 | Admitting: Interventional Cardiology

## 2013-12-29 ENCOUNTER — Encounter: Payer: Self-pay | Admitting: Interventional Cardiology

## 2013-12-29 VITALS — BP 100/70 | HR 78 | Ht 72.0 in | Wt 180.0 lb

## 2013-12-29 DIAGNOSIS — R479 Unspecified speech disturbances: Secondary | ICD-10-CM | POA: Insufficient documentation

## 2013-12-29 DIAGNOSIS — I252 Old myocardial infarction: Secondary | ICD-10-CM

## 2013-12-29 DIAGNOSIS — R4789 Other speech disturbances: Secondary | ICD-10-CM

## 2013-12-29 DIAGNOSIS — E785 Hyperlipidemia, unspecified: Secondary | ICD-10-CM

## 2013-12-29 MED ORDER — ATORVASTATIN CALCIUM 20 MG PO TABS: ORAL_TABLET | ORAL | Status: DC

## 2013-12-29 NOTE — Progress Notes (Signed)
Patient ID: Dale Meadows, male   DOB: 25-Nov-1960, 53 y.o.   MRN: [id number]    [place], [place] [place], [place]  [address] Phone: [phone] Fax:  [phone]  Date:  12/29/2013   ID:  Dale Meadows, DOB 08/17/1960, MRN [id number]  PCP:  No primary provider on file.      History of Present Illness: Dale Meadows is a 53 y.o. 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  He was medically treated.  He has done well.  He walks regularly wihtout chest pain.  No NTG usage.  He had questions about his statin.  He has had some lighheadedness an "thick tongued" speech on just a few occasions.  None in the last week.    He wants to increase his activity level.  He has not tried to lose weight.    Wt Readings from Last 3 Encounters:  12/29/13 180 lb (81.647 kg)  08/10/13 185 lb 1.9 oz (83.97 kg)  07/29/13 197 lb 15.6 oz (89.8 kg)     Past Medical History  Diagnosis Date  . Seizures     last seizure 26 years ago  . Cancer 05/29/2013    prostate  . Ulcerative colitis   . Elevated cholesterol   . Hematuria   . Junctional bradycardia     Current Outpatient Prescriptions  Medication Sig Dispense Refill  . aspirin EC 81 MG tablet Take 1 tablet (81 mg total) by mouth daily.      Marland Kitchen atorvastatin (LIPITOR) 80 MG tablet Take 0.5 tablets (40 mg total) by mouth daily at 6 PM.  30 tablet  11  . clopidogrel (PLAVIX) 75 MG tablet Take 1 tablet (75 mg total) by mouth daily with breakfast.  30 tablet  11  . DELZICOL 400 MG CPDR DR capsule Take 1,200 mg by mouth 2 (two) times daily.       . Multiple Vitamins-Minerals (MULTIVITAMIN WITH MINERALS) tablet Take 1 tablet by mouth daily.      . nitroGLYCERIN (NITROSTAT) 0.4 MG SL tablet Place 1 tablet (0.4 mg total) under the tongue every 5 (five) minutes as needed for chest pain.  25 tablet  12   No current facility-administered medications for this visit.    Allergies:   No  Known Allergies  Social History:  The patient  reports that he quit smoking about 5 years ago. His smoking use included Cigars. He has never used smokeless tobacco. He reports that he drinks alcohol. He reports that he does not use illicit drugs.   Family History:  The patient's family history includes Deep vein thrombosis in his brother; Diabetes type II in his mother; Hepatitis C in his mother; Hypertension in his brother, father, and mother. There is no history of Heart disease.   ROS:  Please see the history of present illness.  No nausea, vomiting.  No fevers, chills.  No focal weakness.  No dysuria.   All other systems reviewed and negative.   PHYSICAL EXAM: VS:  BP 100/70  Pulse 78  Ht 6' (1.829 m)  Wt 180 lb (81.647 kg)  BMI 24.41 kg/m2  SpO2 96% Well nourished, well developed, in no acute distress HEENT: normal Neck: no JVD, no carotid bruits Cardiac:  normal S1, S2; RRR;  Lungs:  clear to auscultation bilaterally, no wheezing, rhonchi or rales Abd: soft, nontender, no hepatomegaly Ext: no edema Skin: warm and dry Neuro:  no focal abnormalities noted      ASSESSMENT AND PLAN:  1. Status post acute inferolateral ST elevation MI: He was treated with angioplasty but this result did not  last. He is now being treated medically. He has no angina. Continue aggressive medical therapy. It appears that his bradycardia has resolved. Could consider initiating low-dose beta blocker, but he has had lightheadedness and lower BP. However, his blood pressure is a little on the low side. We'll hold off for now and see how his blood pressure does. 2. LDL was less than 100 in the hospital. Most recent LDL was 64. He has had some intermittent forgetfulness. Not clear that it is related to his statin but his cholesterol is well controlled. We'll decrease his atorvastatin to 20 mg daily. Recheck lipids in 2 months.  His MI was not the typical MI. He had a large aneurysmal RCA with sluggish flow at  baseline. He likely had a dissection inside the vessel which caused thrombus. Due to the sluggish flow, with thrombus propagated. He really did not have any significant atherosclerosis to speak of. I would allow him to have a higher LDL than usual, particularly if he is having side effects from statin therapy. 3. Continue clopidogrel for now. He had some hematochezia, therefore, we will stop his aspirin. We did discuss the anti-inflammatory effects of these cardiac medications on the vasculature. 4.  I encouraged him to increase his activity as tolerated. 5. We discussed further workup of these potential neurologic symptoms with carotid Doppler. He has no bruits on exam. He would like to hold off. If there is any more episodic, mild thick tongued speech, would plan for carotid Doppler.  Signed, Mina Marble, MD, [facility] 12/29/2013 9:17 AM

## 2013-12-29 NOTE — Patient Instructions (Signed)
Your physician has recommended you make the following change in your medication:  1. Stop Aspirin.   2. Decrease lipitor to 20 mg 1 tablet daily.   Your physician recommends that you return for a FASTING lipid and hepatic 03/06/14  Your physician wants you to follow-up in: 6 months with Dr. Irish Lack. You will receive a reminder letter in the mail two months in advance. If you don't receive a letter, please call our office to schedule the follow-up appointment.

## 2014-01-11 ENCOUNTER — Other Ambulatory Visit (HOSPITAL_COMMUNITY): Payer: Self-pay | Admitting: Physician Assistant

## 2014-01-12 ENCOUNTER — Other Ambulatory Visit: Payer: Self-pay | Admitting: *Deleted

## 2014-01-12 MED ORDER — ATORVASTATIN CALCIUM 20 MG PO TABS: ORAL_TABLET | ORAL | Status: DC

## 2014-01-27 ENCOUNTER — Other Ambulatory Visit: Payer: Self-pay | Admitting: *Deleted

## 2014-01-27 MED ORDER — CLOPIDOGREL BISULFATE 75 MG PO TABS: 75.0000 mg | ORAL_TABLET | Freq: Every day | ORAL | Status: DC

## 2014-01-27 MED ORDER — ATORVASTATIN CALCIUM 20 MG PO TABS: ORAL_TABLET | ORAL | Status: DC

## 2014-02-06 ENCOUNTER — Telehealth: Payer: Self-pay | Admitting: Interventional Cardiology

## 2014-02-06 NOTE — Telephone Encounter (Signed)
Pt had MI in April this year.  Having chest pain since this morning accompanied by left arm numbness. The pain and numbness are constant but get worse at times Denies SOB or diaphoresis. Advised him to go ED for evaluation of his symptoms. He recently moved out of town near [place]. Advised him to call 911.  They know where the hospitals are and which may be more appropriate for heart related symptoms. He plans to have his wife drive him.

## 2014-02-06 NOTE — Telephone Encounter (Signed)
New Message   Pt calling stating that he is having symptoms of a heart attack, pain level 4

## 2014-02-07 ENCOUNTER — Emergency Department (HOSPITAL_COMMUNITY): Payer: 59

## 2014-02-07 ENCOUNTER — Encounter (HOSPITAL_COMMUNITY): Payer: Self-pay | Admitting: Emergency Medicine

## 2014-02-07 ENCOUNTER — Observation Stay (HOSPITAL_COMMUNITY)
Admission: EM | Admit: 2014-02-07 | Discharge: 2014-02-08 | Disposition: A | Discharge status: Home / Self Care | Payer: 59 | Attending: Cardiovascular Disease | Admitting: Cardiovascular Disease

## 2014-02-07 DIAGNOSIS — K5190 Ulcerative colitis, unspecified, without complications: Secondary | ICD-10-CM | POA: Insufficient documentation

## 2014-02-07 DIAGNOSIS — E785 Hyperlipidemia, unspecified: Secondary | ICD-10-CM | POA: Insufficient documentation

## 2014-02-07 DIAGNOSIS — R42 Dizziness and giddiness: Secondary | ICD-10-CM | POA: Insufficient documentation

## 2014-02-07 DIAGNOSIS — Z87891 Personal history of nicotine dependence: Secondary | ICD-10-CM | POA: Insufficient documentation

## 2014-02-07 DIAGNOSIS — I2510 Atherosclerotic heart disease of native coronary artery without angina pectoris: Secondary | ICD-10-CM | POA: Insufficient documentation

## 2014-02-07 DIAGNOSIS — I209 Angina pectoris, unspecified: Secondary | ICD-10-CM

## 2014-02-07 DIAGNOSIS — R9431 Abnormal electrocardiogram [ECG] [EKG]: Secondary | ICD-10-CM | POA: Diagnosis not present

## 2014-02-07 DIAGNOSIS — R001 Bradycardia, unspecified: Secondary | ICD-10-CM | POA: Diagnosis not present

## 2014-02-07 DIAGNOSIS — Z79899 Other long term (current) drug therapy: Secondary | ICD-10-CM | POA: Insufficient documentation

## 2014-02-07 DIAGNOSIS — R079 Chest pain, unspecified: Secondary | ICD-10-CM | POA: Diagnosis present

## 2014-02-07 DIAGNOSIS — Z8546 Personal history of malignant neoplasm of prostate: Secondary | ICD-10-CM | POA: Insufficient documentation

## 2014-02-07 DIAGNOSIS — I25118 Atherosclerotic heart disease of native coronary artery with other forms of angina pectoris: Secondary | ICD-10-CM

## 2014-02-07 DIAGNOSIS — R0789 Other chest pain: Secondary | ICD-10-CM

## 2014-02-07 DIAGNOSIS — Z7902 Long term (current) use of antithrombotics/antiplatelets: Secondary | ICD-10-CM | POA: Insufficient documentation

## 2014-02-07 DIAGNOSIS — C61 Malignant neoplasm of prostate: Secondary | ICD-10-CM | POA: Diagnosis present

## 2014-02-07 DIAGNOSIS — R569 Unspecified convulsions: Secondary | ICD-10-CM

## 2014-02-07 DIAGNOSIS — I252 Old myocardial infarction: Secondary | ICD-10-CM | POA: Diagnosis not present

## 2014-02-07 HISTORY — DX: Atherosclerotic heart disease of native coronary artery without angina pectoris: I25.10

## 2014-02-07 HISTORY — DX: Hyperlipidemia, unspecified: E78.5

## 2014-02-07 LAB — I-STAT TROPONIN, ED
Troponin i, poc: 0.01 ng/mL (ref 0.00–0.08)
Troponin i, poc: 0 ng/mL (ref 0.00–0.08)

## 2014-02-07 LAB — PROTIME-INR
INR: 0.98 (ref 0.00–1.49)
Prothrombin Time: 13 seconds (ref 11.6–15.2)

## 2014-02-07 LAB — BASIC METABOLIC PANEL
ANION GAP: 12 (ref 5–15)
BUN: 14 mg/dL (ref 6–23)
CALCIUM: 9.4 mg/dL (ref 8.4–10.5)
CO2: 22 meq/L (ref 19–32)
CREATININE: 0.84 mg/dL (ref 0.50–1.35)
Chloride: 106 mEq/L (ref 96–112)
GFR calc Af Amer: >90 mL/min (ref >90)
Glucose, Bld: 97 mg/dL (ref 70–99)
Potassium: 3.9 mEq/L (ref 3.7–5.3)
Sodium: 140 mEq/L (ref 137–147)

## 2014-02-07 LAB — CBC
HCT: 40.1 % (ref 39.0–52.0)
HEMATOCRIT: 41.5 % (ref 39.0–52.0)
HEMOGLOBIN: 14.4 g/dL (ref 13.0–17.0)
Hemoglobin: 14 g/dL (ref 13.0–17.0)
MCH: 29.5 pg (ref 26.0–34.0)
MCH: 29.4 pg (ref 26.0–34.0)
MCHC: 34.9 g/dL (ref 30.0–36.0)
MCHC: 34.7 g/dL (ref 30.0–36.0)
MCV: 84.4 fL (ref 78.0–100.0)
MCV: 84.7 fL (ref 78.0–100.0)
PLATELETS: 265 10*3/uL (ref 150–400)
Platelets: 279 10*3/uL (ref 150–400)
RBC: 4.75 MIL/uL (ref 4.22–5.81)
RBC: 4.9 MIL/uL (ref 4.22–5.81)
RDW: 12.8 % (ref 11.5–15.5)
RDW: 12.9 % (ref 11.5–15.5)
WBC: 6.1 10*3/uL (ref 4.0–10.5)
WBC: 6.7 10*3/uL (ref 4.0–10.5)

## 2014-02-07 LAB — TSH: TSH: 1.37 u[IU]/mL (ref 0.350–4.500)

## 2014-02-07 LAB — CREATININE, SERUM
Creatinine, Ser: 0.86 mg/dL (ref 0.50–1.35)
GFR calc Af Amer: >90 mL/min (ref >90)
GFR calc non Af Amer: >90 mL/min (ref >90)

## 2014-02-07 LAB — TROPONIN I

## 2014-02-07 MED ORDER — ASPIRIN 81 MG PO CHEW: 324.0000 mg | CHEWABLE_TABLET | ORAL | Status: DC

## 2014-02-07 MED ORDER — CLOPIDOGREL BISULFATE 75 MG PO TABS
75.0000 mg | ORAL_TABLET | Freq: Every day | ORAL | Status: DC
  Administered 2014-02-08: 75 mg via ORAL
  Filled 2014-02-07: qty 1

## 2014-02-07 MED ORDER — RANOLAZINE ER 500 MG PO TB12
500.0000 mg | ORAL_TABLET | Freq: Two times a day (BID) | ORAL | Status: DC
  Filled 2014-02-07 (×3): qty 1

## 2014-02-07 MED ORDER — HEPARIN SODIUM (PORCINE) 5000 UNIT/ML IJ SOLN
5000.0000 [IU] | Freq: Three times a day (TID) | INTRAMUSCULAR | Status: DC
  Administered 2014-02-07 – 2014-02-08 (×2): 5000 [IU] via SUBCUTANEOUS
  Filled 2014-02-07 (×4): qty 1

## 2014-02-07 MED ORDER — MESALAMINE 400 MG PO CPDR
800.0000 mg | DELAYED_RELEASE_CAPSULE | Freq: Two times a day (BID) | ORAL | Status: DC
  Administered 2014-02-07 – 2014-02-08 (×2): 800 mg via ORAL
  Filled 2014-02-07 (×4): qty 2

## 2014-02-07 MED ORDER — ASPIRIN 300 MG RE SUPP: 300.0000 mg | RECTAL | Status: DC

## 2014-02-07 MED ORDER — ACETAMINOPHEN 325 MG PO TABS: 650.0000 mg | ORAL_TABLET | ORAL | Status: DC | PRN

## 2014-02-07 MED ORDER — NITROGLYCERIN 0.4 MG SL SUBL: 0.4000 mg | SUBLINGUAL_TABLET | SUBLINGUAL | Status: DC | PRN

## 2014-02-07 MED ORDER — INFLUENZA VAC SPLIT QUAD 0.5 ML IM SUSY
0.5000 mL | PREFILLED_SYRINGE | INTRAMUSCULAR | Status: AC
  Administered 2014-02-08: 0.5 mL via INTRAMUSCULAR
  Filled 2014-02-07: qty 0.5

## 2014-02-07 MED ORDER — SODIUM CHLORIDE 0.9 % IJ SOLN: 3.0000 mL | INTRAMUSCULAR | Status: DC | PRN

## 2014-02-07 MED ORDER — ATORVASTATIN CALCIUM 20 MG PO TABS
20.0000 mg | ORAL_TABLET | Freq: Every day | ORAL | Status: DC
  Filled 2014-02-07 (×2): qty 1

## 2014-02-07 MED ORDER — ONDANSETRON HCL 4 MG/2ML IJ SOLN: 4.0000 mg | Freq: Four times a day (QID) | INTRAMUSCULAR | Status: DC | PRN

## 2014-02-07 MED ORDER — SODIUM CHLORIDE 0.9 % IV SOLN: 250.0000 mL | INTRAVENOUS | Status: DC | PRN

## 2014-02-07 MED ORDER — SODIUM CHLORIDE 0.9 % IJ SOLN
3.0000 mL | Freq: Two times a day (BID) | INTRAMUSCULAR | Status: DC
  Administered 2014-02-07: 3 mL via INTRAVENOUS

## 2014-02-07 MED ORDER — ASPIRIN EC 81 MG PO TBEC
81.0000 mg | DELAYED_RELEASE_TABLET | Freq: Every day | ORAL | Status: DC
  Filled 2014-02-07: qty 1

## 2014-02-07 MED ORDER — ISOSORBIDE MONONITRATE 15 MG HALF TABLET
15.0000 mg | ORAL_TABLET | Freq: Every day | ORAL | Status: DC
  Filled 2014-02-07 (×2): qty 1

## 2014-02-07 NOTE — H&P (Signed)
Patient ID: Dale Meadows MRN: [id number], DOB/AGE: 05-13-1960   Admit date: 02/07/2014   Primary Physician: No primary provider on file. Primary Cardiologist: Dr. Beau Fanny  Pt. Profile:  Dale Meadows is a 53 y.o. male with a history of CAD s/p inferior STEMI with aspiration thrombectomy of the RCA and PTCA of the RCA/PDA, ulcerative colitis- with hematochezia, HLD, prostate cancer and remote seizures who presented to [facility] today with chest pain.   The patient had been doing well and seen in the office by Dr. Thersa Salt in 12/2013 with no complaints of chest pain at that time. However, yesterday and he started to notice chest pain and left arm numbness. He called the office and they directed him to the nearest ED. He lives in [place] and presented to the [facility] and ruled out for myocardial infarction and was allowed to go home later that night, although they preferred he stayed. He ended up going home as he felt okay and had no further chest pain. He presented for work in [place] and at around 11 AM he noticed chest pain with associated left arm numbness. It reminded him of his previous symptoms prior to his inferior STEMI in 07/2013. However, there were some notable differences, including the lack of jaw pain or diaphoresis. Additionally, the numbness was in his right hand with his previous event was now in his left hand/arm. No lower extremity edema, orthopnea, PND, lightheadedness or syncope. The chest pain is now fully resolved after sublingual nitroglycerin.    Problem List  Past Medical History  Diagnosis Date  . Seizures     last seizure 26 years ago  . Cancer 05/29/2013    prostate  . Ulcerative colitis   . Elevated cholesterol   . Hematuria   . Junctional bradycardia     Past Surgical History  Procedure Laterality Date  . Biopsy prostate       Allergies  No Known Allergies   Home Medications  Prior to Admission medications   Medication Sig  Start Date End Date Taking? Authorizing Provider  atorvastatin (LIPITOR) 20 MG tablet Take 20 mg by mouth daily.   Yes Historical Provider, MD  clopidogrel (PLAVIX) 75 MG tablet Take 1 tablet (75 mg total) by mouth daily with breakfast. 01/27/14  Yes Jettie Booze, MD  DELZICOL 400 MG CPDR DR capsule Take 800 mg by mouth 2 (two) times daily.  07/21/13  Yes Historical Provider, MD  Multiple Vitamins-Minerals (MULTIVITAMIN WITH MINERALS) tablet Take 1 tablet by mouth daily.   Yes Historical Provider, MD  nitroGLYCERIN (NITROSTAT) 0.4 MG SL tablet Place 1 tablet (0.4 mg total) under the tongue every 5 (five) minutes as needed for chest pain. 08/01/13  Yes Eileen Stanford, PA-C    Family History  Family History  Problem Relation Age of Onset  . Heart disease Neg Hx   . Hepatitis C Mother   . Diabetes type II Mother   . Hypertension Mother   . Hypertension Father   . Hypertension Brother   . Deep vein thrombosis Brother    Family Status  Relation Status Death Age  . Son Alive   . Mother Deceased   . Father Alive   . Brother Alive   . Maternal Grandmother Deceased   . Maternal Grandfather Deceased   . Paternal Grandmother Deceased   . Paternal Grandfather Deceased   . Brother Alive   . Son Alive   . Son [employer]  Social History  History   Social History  . Marital Status: Married    Spouse Name: N/A    Number of Children: N/A  . Years of Education: N/A   Occupational History  . Not on file.   Social History Main Topics  . Smoking status: Former Smoker    Types: Cigars    Quit date: 07/27/2008  . Smokeless tobacco: Never Used  . Alcohol Use: Yes     Comment: occassional beer or wine, < 1 per week  . Drug Use: No  . Sexual Activity: Yes   Other Topics Concern  . Not on file   Social History Narrative  . No narrative on file     All other systems reviewed and are otherwise negative except as noted above.  Physical Exam  Blood pressure 104/72, pulse  63, temperature 98.3 F (36.8 C), temperature source Oral, resp. rate 14, height 6' (1.829 m), weight 175 lb (79.379 kg), SpO2 95.00%.  General: Pleasant, NAD Psych: Normal affect. Neuro: Alert and oriented X 3. Moves all extremities spontaneously. HEENT: Normal  Neck: Supple without bruits or JVD. Lungs:  Resp regular and unlabored, CTA. Heart: RRR no s3, s4, or murmurs. Abdomen: Soft, non-tender, non-distended, BS + x 4.  Extremities: No clubbing, cyanosis or edema. DP/PT/Radials 2+ and equal bilaterally.  Labs  No results found for this basename: CKTOTAL, CKMB, TROPONINI,  in the last 72 hours Lab Results  Component Value Date   WBC 6.1 02/07/2014   HGB 14.0 02/07/2014   HCT 40.1 02/07/2014   MCV 84.4 02/07/2014   PLT 265 02/07/2014    Recent Labs Lab 02/07/14 1220  NA 140  K 3.9  CL 106  CO2 22  BUN 14  CREATININE 0.84  CALCIUM 9.4  GLUCOSE 97   Lab Results  Component Value Date   CHOL 130 11/09/2013   HDL 36.00* 11/09/2013   LDLCALC 64 11/09/2013   TRIG 152.0* 11/09/2013      Radiology/Studies  Dg Chest 2 View  02/07/2014   ADDENDUM REPORT: 02/07/2014 13:59  ADDENDUM: Addendum to chest x-ray 02/07/2014. No power port is present. No acute cardiopulmonary disease present. Mild basilar atelectasis and/or scarring. Heart size normal. Thoracic spine scoliosis.   Electronically Signed   By: Marcello Moores  Register   On: 02/07/2014 13:59   02/07/2014   CLINICAL DATA:  Postop evaluation.  Prior PowerPort placement .  EXAM: CHEST  2 VIEW  COMPARISON:  01/02/2013.  FINDINGS: Prior port noted in good anatomic position. Mediastinum hilar structures normal. The lungs are clear. Heart size normal. No pleural effusion or pneumothorax. No acute bony abnormality.  IMPRESSION: Power port in good anatomic position. No acute cardiopulmonary disease.  Electronically Signed: By: Marcello Moores  Register On: 02/07/2014 13:06    ECG  NSR with inferior Q waves and TWI in  III, AVF and V6.    ASSESSMENT AND PLAN\  Dale Meadows is a 53 y.o. male with a history of CAD s/p inferior STEMI with aspiration thrombectomy of the RCA and PTCA of the RCA/PDA, ulcerative colitis- with hx of hematochezia, HLD, prostate cancer and remote seizures who presented to [facility] today with chest pain.    CAD- s/p acute inferolateral STEMI in 07/2012: He was treated with angioplasty but this result did not last. He is now being treated medically. -- Troponin neg x1. ECG with TWI in  III, AVF and V6. -- Continue Plavix and statin ( no DAPT due to UC and hx of hematochezia).  No BB due to symptomatic hypotension -- Continue to cycle troponin and rule out overnight.  -- Will trail on Ranolazine 517m BID and imdur 15 mg  Ischemic CM- EF~40% and inferior wall akinesis by cardiac cath. BP would not tolerate an ACE or BB currently.  -- Consider repeat ECHO to assess LV function as an outpatient   HLD- His LDL was less than 100 in the hospital. Most recent LDL was 64. He has had some intermittent forgetfulness. Not clear that it is related to his statin but his cholesterol is well controlled. He did not have a typical MI.  He had a large aneurysmal RCA with sluggish flow at baseline. He likely had a dissection inside the vessel which caused thrombus. Due to the sluggish flow, with thrombus propagated. He really did not have any significant atherosclerosis to speak of. Therefore, Dr VIrish Lackallows him to have a higher LDL than usual, particularly if he is having side effects from statin therapy. -- Continue atorvastatin to 20 mg daily     Signed, TEileen Stanford PA-C 02/07/2014, 3:02 PM  Pager [phone]  Patient seen and examined. Agree with assessment and plan.  Mr. WManus Weedmanis a 53year old gentleman who suffered inferior wall ST segment elevation myocardial infarction in April 2015.  He was found to have large thrombus burden in the distal RCA at the bifurcation of the PLA and PDA.  His  distal RCA and PDA were successfully treated with balloon angioplasty but the posterolateral vessel remained occluded with large thrombus burden distally.  Ejection fraction at catheterization was 50%.  He was treated with antiplatelet therapy initially, but due to bradycardia beta blockade was not instituted.  Due to somewhat low blood pressure, he has not been on good antianginal medical therapy and most recently has just been on Plavix 75 mg alone after his ASA was discontinued with his history of ulcerative colitis and easy bruisability.  He last saw Dr. VIrish Lackone month ago.  Yesterday, he experienced chest discomfort, which did not improve with his old nitroglycerin.  [place] ECG was felt to be unremarkable.  [place] [employer] [place] chest discomfort.  He did take new nitroglycerin and did note significant nitrate responsiveness, but blood pressure became low.  He presented to the emergency room for further evaluation.  Currently, he is pain-free.  His ECG shows findings compatible with his old inferior MI and T-wave changes without new ST segment abnormalities.  His resting pulse is 60.  Current blood pressure is 1706systolically.  He is on lipid-lowering therapy.  Presently, I will plan to admit him overnight for at least observation.  We will try starting him on low dose isosorbide mononitrate 15 mg.  He also will be a candidate for ranolazine 500 mg twice a day, which should not have adverse effects on his heart rate or blood pressure and should provide anti-ischemic benefit, particularly with his chronic occlusion.  If he does develop recurrent chest pain symptomatology, definitive catheterization may be considered since he may have had some partial revascularization of his previous occlusion and may have some potential benefit for consideration of her cutaneous coronary  intervention if needed.   TTroy Sine MD, [facility]10/13/2015 3:44 PM

## 2014-02-07 NOTE — ED Notes (Signed)
Patient was seen in ED yesterday for same complaint of chest pain.  Patient reports he went home yesterday with the agreement to return if symptoms persisted or worsened.  Patient reports that he was at work today and began to have chest pain again.  Patient took one nitro prior to EMS arrival.  Patient is also reporting some nausea and dizziness after taking nitro.

## 2014-02-07 NOTE — ED Notes (Signed)
Patient received 363m Aspirin prior to arrival.

## 2014-02-07 NOTE — ED Notes (Signed)
Cardiology PA at bedside

## 2014-02-07 NOTE — ED Provider Notes (Signed)
CSN: [id number]     Arrival date & time 02/07/14  1157 History   First MD Initiated Contact with Patient 02/07/14 1207     Chief Complaint  Patient presents with  . Chest Pain     (Consider location/radiation/quality/duration/timing/severity/associated sxs/prior Treatment) Patient is a 53 y.o. male presenting with chest pain.  Chest Pain Pain location:  L chest Pain quality: sharp   Pain radiates to:  Does not radiate Pain radiates to the back: no   Pain severity:  Moderate Onset quality:  Sudden Duration:  15 minutes Timing:  Constant Progression:  Resolved Chronicity:  Recurrent Context: eating and at rest   Relieved by:  Nothing Worsened by:  Nothing tried Ineffective treatments:  Nitroglycerin Associated symptoms: dizziness   Associated symptoms: no abdominal pain, no cough, no diaphoresis, no fever, no headache, no nausea, no shortness of breath, no syncope and not vomiting   Risk factors: high cholesterol and male sex   Risk factors: no hypertension, no prior DVT/PE and no smoking   Risk factors comment:  MI in april   Past Medical History  Diagnosis Date  . Seizures     last seizure 26 years ago  . Cancer 05/29/2013    prostate  . Ulcerative colitis   . Elevated cholesterol   . Hematuria   . Junctional bradycardia   . CAD (coronary artery disease)    Past Surgical History  Procedure Laterality Date  . Biopsy prostate     Family History  Problem Relation Age of Onset  . Heart disease Neg Hx   . Hepatitis C Mother   . Diabetes type II Mother   . Hypertension Mother   . Hypertension Father   . Hypertension Brother   . Deep vein thrombosis Brother    History  Substance Use Topics  . Smoking status: Former Smoker    Types: Cigars    Quit date: 07/27/2008  . Smokeless tobacco: Never Used  . Alcohol Use: Yes     Comment: occassional beer or wine, < 1 per week    Review of Systems  Constitutional: Negative for fever, chills and diaphoresis.  HENT:  Negative for congestion and sore throat.   Eyes: Negative for visual disturbance.  Respiratory: Negative for cough, shortness of breath and wheezing.   Cardiovascular: Positive for chest pain. Negative for syncope.  Gastrointestinal: Negative for nausea, vomiting, abdominal pain, diarrhea and constipation.  Genitourinary: Negative for dysuria and difficulty urinating.  Musculoskeletal: Negative for arthralgias and myalgias.  Skin: Negative for wound.  Neurological: Positive for dizziness. Negative for syncope and headaches.  Psychiatric/Behavioral: Negative for behavioral problems.  All other systems reviewed and are negative.     Allergies  Review of patient's allergies indicates no known allergies.  Home Medications   Prior to Admission medications   Medication Sig Start Date End Date Taking? Authorizing Provider  atorvastatin (LIPITOR) 20 MG tablet Take 20 mg by mouth daily.   Yes Historical Provider, MD  clopidogrel (PLAVIX) 75 MG tablet Take 1 tablet (75 mg total) by mouth daily with breakfast. 01/27/14  Yes Jettie Booze, MD  DELZICOL 400 MG CPDR DR capsule Take 800 mg by mouth 2 (two) times daily.  07/21/13  Yes Historical Provider, MD  Multiple Vitamins-Minerals (MULTIVITAMIN WITH MINERALS) tablet Take 1 tablet by mouth daily.   Yes Historical Provider, MD  nitroGLYCERIN (NITROSTAT) 0.4 MG SL tablet Place 1 tablet (0.4 mg total) under the tongue every 5 (five) minutes as needed for chest  pain. 08/01/13  Yes Eileen Stanford, PA-C   BP 92/62  Pulse 59  Temp(Src) 98.3 F (36.8 C) (Oral)  Resp 20  SpO2 96% Physical Exam  Vitals reviewed. Constitutional: He is oriented to person, place, and time. He appears well-developed and well-nourished.  HENT:  Head: Normocephalic and atraumatic.  Eyes: EOM are normal.  Neck: Normal range of motion.  Cardiovascular: Normal rate, regular rhythm and normal heart sounds.   No murmur heard. Pulmonary/Chest: Effort normal and breath  sounds normal. No respiratory distress. He has no wheezes. He has no rales.  Abdominal: Soft. There is no tenderness.  Musculoskeletal: He exhibits no edema.  Neurological: He is alert and oriented to person, place, and time.  Skin: No rash noted. He is not diaphoretic.    ED Course  Procedures (including critical care time) Labs Review Labs Reviewed  CBC  BASIC METABOLIC PANEL  I-STAT [place], ED  [facility] Idol, ED  [facility] Idol, ED    Imaging Review Dg Chest 2 View  02/07/2014   ADDENDUM REPORT: 02/07/2014 13:59  ADDENDUM: Addendum to chest x-ray 02/07/2014. No power port is present. No acute cardiopulmonary disease present. Mild basilar atelectasis and/or scarring. Heart size normal. Thoracic spine scoliosis.   Electronically Signed   By: Marcello Moores  Register   On: 02/07/2014 13:59   02/07/2014   CLINICAL DATA:  Postop evaluation.  Prior PowerPort placement .  EXAM: CHEST  2 VIEW  COMPARISON:  01/02/2013.  FINDINGS: Prior port noted in good anatomic position. Mediastinum hilar structures normal. The lungs are clear. Heart size normal. No pleural effusion or pneumothorax. No acute bony abnormality.  IMPRESSION: Power port in good anatomic position. No acute cardiopulmonary disease.  Electronically Signed: By: Marcello Moores  Register On: 02/07/2014 13:06     EKG Interpretation None      MDM   Final diagnoses:  T wave inversion in EKG  Left sided chest pain     Patient is a 53 year old male with history of myocardial infarction in April 2015 currently on Plavix that presents with chest pain. Patient states this pain happened yesterday he had a sharp left-sided chest pain was intermittent throughout the day and he was seen at [facility] where they wanted to admit him however he decided to leave at that time and come back and he had any return of symptoms. This morning following her drink coffee patient stated he was sitting at his desk working when he told a  sharp left-sided chest pain that resolved 15 minutes later. Patient took a nitroglycerin however he stated did not help pain but did make him dizzy and diaphoretic. On route to the ED patient was mildly hypotensive however while he was in ED without intervention he became normotensive. Patient is currently asymptomatic at this time. Patient was given aspirin by EMS. Patient's workup is unremarkable the laboratory standpoint. Patient's EKG is concerning as he has evidence of an old infarct in the inferior lateral leads and currently has T wave inversion in leads 3 and aVF. Unfortunately there are no other EKGs to compare to except during his acute event in April. Cardiology consulted and will follow their recs.  Cardiology evaluated the patient had filling of benefit from being started on Imdur and admitted for more provocative testing in the morning.   Renne Musca, MD 02/07/14 [place], MD 02/07/14 [phone]

## 2014-02-08 ENCOUNTER — Encounter (HOSPITAL_COMMUNITY): Payer: Self-pay | Admitting: Physician Assistant

## 2014-02-08 DIAGNOSIS — R9431 Abnormal electrocardiogram [ECG] [EKG]: Secondary | ICD-10-CM | POA: Diagnosis not present

## 2014-02-08 DIAGNOSIS — R079 Chest pain, unspecified: Secondary | ICD-10-CM | POA: Diagnosis not present

## 2014-02-08 DIAGNOSIS — E785 Hyperlipidemia, unspecified: Secondary | ICD-10-CM | POA: Diagnosis not present

## 2014-02-08 DIAGNOSIS — K5190 Ulcerative colitis, unspecified, without complications: Secondary | ICD-10-CM | POA: Diagnosis not present

## 2014-02-08 DIAGNOSIS — I2510 Atherosclerotic heart disease of native coronary artery without angina pectoris: Secondary | ICD-10-CM | POA: Diagnosis present

## 2014-02-08 DIAGNOSIS — R0789 Other chest pain: Secondary | ICD-10-CM

## 2014-02-08 LAB — CBC
HCT: 41.1 % (ref 39.0–52.0)
Hemoglobin: 14.3 g/dL (ref 13.0–17.0)
MCH: 29.4 pg (ref 26.0–34.0)
MCHC: 34.8 g/dL (ref 30.0–36.0)
MCV: 84.6 fL (ref 78.0–100.0)
PLATELETS: 269 10*3/uL (ref 150–400)
RBC: 4.86 MIL/uL (ref 4.22–5.81)
RDW: 12.9 % (ref 11.5–15.5)
WBC: 5.1 10*3/uL (ref 4.0–10.5)

## 2014-02-08 LAB — COMPREHENSIVE METABOLIC PANEL
ALBUMIN: 3.3 g/dL — AB (ref 3.5–5.2)
ALT: 31 U/L (ref 0–53)
AST: 28 U/L (ref 0–37)
Alkaline Phosphatase: 70 U/L (ref 39–117)
Anion gap: 11 (ref 5–15)
BILIRUBIN TOTAL: 0.4 mg/dL (ref 0.3–1.2)
BUN: 15 mg/dL (ref 6–23)
CHLORIDE: 106 meq/L (ref 96–112)
CO2: 25 mEq/L (ref 19–32)
Calcium: 9.1 mg/dL (ref 8.4–10.5)
Creatinine, Ser: 0.98 mg/dL (ref 0.50–1.35)
GFR calc Af Amer: >90 mL/min (ref >90)
GFR calc non Af Amer: >90 mL/min (ref >90)
Glucose, Bld: 131 mg/dL — ABNORMAL HIGH (ref 70–99)
POTASSIUM: 3.7 meq/L (ref 3.7–5.3)
Sodium: 142 mEq/L (ref 137–147)
Total Protein: 6.9 g/dL (ref 6.0–8.3)

## 2014-02-08 LAB — LIPID PANEL
CHOLESTEROL: 188 mg/dL (ref 0–200)
HDL: 39 mg/dL — ABNORMAL LOW (ref >39)
LDL Cholesterol: 96 mg/dL (ref 0–99)
Total CHOL/HDL Ratio: 4.8 RATIO
Triglycerides: 263 mg/dL — ABNORMAL HIGH (ref <150)
VLDL: 53 mg/dL — ABNORMAL HIGH (ref 0–40)

## 2014-02-08 LAB — PROTIME-INR
INR: 1.01 (ref 0.00–1.49)
Prothrombin Time: 13.3 seconds (ref 11.6–15.2)

## 2014-02-08 LAB — TROPONIN I

## 2014-02-08 NOTE — Progress Notes (Signed)
Patient Name: Dale Meadows Date of Encounter: 02/08/2014     Active Problems:   Chest pain    SUBJECTIVE  Feeling well. No further chest pain. Would like to go home. Very pleasant.   CURRENT MEDS . aspirin  324 mg Oral NOW   Or  . aspirin  300 mg Rectal NOW  . aspirin EC  81 mg Oral Daily  . atorvastatin  20 mg Oral Daily  . clopidogrel  75 mg Oral Q breakfast  . heparin  5,000 Units Subcutaneous 3 times per day  . Influenza vac split quadrivalent PF  0.5 mL Intramuscular Tomorrow-1000  . isosorbide mononitrate  15 mg Oral Daily  . Mesalamine  800 mg Oral BID  . ranolazine  500 mg Oral BID  . sodium chloride  3 mL Intravenous Q12H    OBJECTIVE  Filed Vitals:   02/07/14 1700 02/07/14 1745 02/07/14 2009 02/08/14 0516  BP: 113/67 106/65 108/61 102/66  Pulse: 65 60 66 58  Temp:  98.2 F (36.8 C) 98 F (36.7 C) 97.7 F (36.5 C)  TempSrc:  Oral Oral Oral  Resp: 15 17 16 16   Height:  6' (1.829 m)    Weight:  177 lb 4 oz (80.4 kg)  173 lb 12.8 oz (78.835 kg)  SpO2: 95% 96% 93% 95%   No intake or output data in the 24 hours ending 02/08/14 0800 Filed Weights   02/07/14 1208 02/07/14 1745 02/08/14 0516  Weight: 175 lb (79.379 kg) 177 lb 4 oz (80.4 kg) 173 lb 12.8 oz (78.835 kg)    PHYSICAL EXAM  General: Pleasant, NAD. Neuro: Alert and oriented X 3. Moves all extremities spontaneously. Psych: Normal affect. HEENT:  Normal  Neck: Supple without bruits or JVD. Lungs:  Resp regular and unlabored, CTA. Heart: RRR no s3, s4, or murmurs. Abdomen: Soft, non-tender, non-distended, BS + x 4.  Extremities: No clubbing, cyanosis or edema. DP/PT/Radials 2+ and equal bilaterally.  Accessory Clinical Findings  CBC  Recent Labs  02/07/14 1910 02/08/14 0001  WBC 6.7 5.1  HGB 14.4 14.3  HCT 41.5 41.1  MCV 84.7 84.6  PLT 279 101   Basic Metabolic Panel  Recent Labs  02/07/14 1220 02/07/14 1910 02/08/14 0001  NA 140  --  142  K 3.9  --  3.7  CL 106  --   106  CO2 22  --  25  GLUCOSE 97  --  131*  BUN 14  --  15  CREATININE 0.84 0.86 0.98  CALCIUM 9.4  --  9.1   Liver Function Tests  Recent Labs  02/08/14 0001  AST 28  ALT 31  ALKPHOS 70  BILITOT 0.4  PROT 6.9  ALBUMIN 3.3*    Cardiac Enzymes  Recent Labs  02/07/14 1910 02/08/14 0001 02/08/14 0550  TROPONINI <0.30 <0.30 <0.30   Fasting Lipid Panel  Recent Labs  02/08/14 0001  CHOL 188  HDL 39*  LDLCALC 96  TRIG 263*  CHOLHDL 4.8   Thyroid Function Tests  Recent Labs  02/07/14 1910  TSH 1.370    TELE  NSR with periods of sinus brady in the 40s   Radiology/Studies  Dg Chest 2 View  02/07/2014   ADDENDUM REPORT: 02/07/2014 13:59  ADDENDUM: Addendum to chest x-ray 02/07/2014. No power port is present. No acute cardiopulmonary disease present. Mild basilar atelectasis and/or scarring. Heart size normal. Thoracic spine scoliosis.   Electronically Signed   By: Marcello Moores  Register   On:  02/07/2014 13:59   02/07/2014   CLINICAL DATA:  Postop evaluation.  Prior PowerPort placement .  EXAM: CHEST  2 VIEW  COMPARISON:  01/02/2013.  FINDINGS: Prior port noted in good anatomic position. Mediastinum hilar structures normal. The lungs are clear. Heart size normal. No pleural effusion or pneumothorax. No acute bony abnormality.  IMPRESSION: Power port in good anatomic position. No acute cardiopulmonary disease.  Electronically Signed: By: Marcello Moores  Register On: 02/07/2014 13:06    ASSESSMENT AND PLAN  Dale Meadows is a 53 y.o. male with a history of CAD s/p inferior STEMI with aspiration thrombectomy of the RCA and PTCA of the RCA/PDA, ulcerative colitis- with hx of hematochezia, HLD, prostate cancer and remote seizures who presented to [facility] on 02/07/14 with chest pain.   CAD- s/p acute inferolateral STEMI in 07/2012: He was treated with angioplasty but this result did not last. He is now being treated medically.  -- Troponin neg x3. ECG with TWI in III, AVF and V6 but no  new ST segment changes. -- Continue Plavix and statin (no DAPT due to UC and hx of hematochezia). No BB due to symptomatic hypotension  -- Planned to trial Ranolazine 574m BID and imdur 15 mg but patient refused. He can live with the chest pain as long as he knows it is not life threatening  Ischemic CM- EF~50% by ventriculogram on 07/27/13 and ~40% by relook cath on 07/29/13 w/ inferior wall akinesis.  -- BP would not tolerate an ACE or BB currently.  -- Consider repeat ECHO to assess LV function as an outpatient   HLD- His LDL was less than 100 in the hospital. Most recent LDL was 64. He reported some intermittent forgetfulness in the office. Not clear that it is related to his statin but his cholesterol is well controlled. He did not have a typical MI. He had a large aneurysmal RCA with sluggish flow at baseline. He likely had a dissection inside the vessel which caused thrombus. Due to the sluggish flow, with thrombus propagated. He really did not have any significant atherosclerosis to speak of. Therefore, Dr VIrish Lackallows him to have a higher LDL than usual, particularly if he is having side effects from statin therapy.  -- Continue atorvastatin to 20 mg daily (does not want high dose statin) -- Repeat lipid panel on this admission with TC 188; TG 263; HDL 39; LDL 96  Dispo- likely okay to go home today with close follow up in the office. If he continues to have chest pain or it becomes more bothersome, he may be willing to try ranolazine or imdur at that time.   SJudy PimplePA-C  Pager [phone] Personally seen and examined. Agree with above. OK with DC  SCandee Furbish MD

## 2014-02-08 NOTE — Discharge Summary (Signed)
Discharge Summary   Patient ID: Dale Meadows MRN: [id number], DOB/AGE: 01-08-1961 53 y.o. Admit date: 02/07/2014 D/C date:     02/08/2014  Primary Cardiologist: Dr. Irish Lack  Principal Problem:   Chest pain Active Problems:   Ulcerative colitis   HLD (hyperlipidemia)   Seizures   Prostate cancer   CAD (coronary artery disease)    Admission Dates: 02/07/14-02/08/14 Discharge Diagnosis: Chest pain s/p observation and rule out for acute MI  HPI: Dale Meadows is a 53 y.o. male with a history of CAD s/p inferior STEMI with aspiration thrombectomy of the RCA and PTCA of the RCA/PDA, ulcerative colitis- with hx of hematochezia, HLD, prostate cancer and remote seizures who [facility] to [facility] on 02/07/14 with chest pain.   Dale Meadows suffered an inferior wall STEMI in April 2015. He was found to have large thrombus burden in the distal RCA at the bifurcation of the PLA and PDA. His distal RCA and PDA were successfully treated with balloon angioplasty but the posterolateral vessel remained occluded with large thrombus burden distally. Ejection fraction at catheterization was 50% and then 40% by re-look cath a couple days later. He was treated with antiplatelet therapy initially, but due to bradycardia beta blockade was not instituted. Due to somewhat low blood pressure, he has not been on good antianginal medical therapy and most recently has just been on Plavix 75 mg alone after his ASA was discontinued with his history of ulcerative colitis and easy bruisability. He last saw Dr. Irish Lack one month ago. A day before admission, he experienced chest discomfort, which did not improve with his old nitroglycerin. He was evaluated at the emergency room in [place] where he was sent home and his ECG was felt to be unremarkable. On the day of admission, he experienced similar chest discomfort while at work in [employer]. He did take new nitroglycerin and did note significant nitrate  responsiveness, but blood pressure became low. He presented to the emergency room for further evaluation.    Hospital Course  CAD- s/p acute inferolateral STEMI in 07/2012: He was treated with angioplasty but this result did not last. He is now being treated medically.  -- Troponin neg x3. ECG with TWI in III, AVF and V6 but no new ST segment changes.  -- Continue Plavix and statin (no DAPT due to UC and hx of hematochezia). No BB due to symptomatic hypotension  -- Planned to trial Ranolazine 523m BID and imdur 15 mg but patient refused. He can live with the chest pain as long as he knows it is not life threatening   Ischemic CM- EF~50% by ventriculogram on 07/27/13 and ~40% by relook cath on 07/29/13 w/ inferior wall akinesis.  -- BP would not tolerate an ACE or BB currently.  -- Consider repeat ECHO to assess LV function as an outpatient   HLD- His LDL was less than 100 in the hospital. Most recent LDL was 64. He reported some intermittent forgetfulness in the office. Not clear that it is related to his statin but his cholesterol is well controlled. He did not have a typical MI. He had a large aneurysmal RCA with sluggish flow at baseline. He likely had a dissection inside the vessel which caused thrombus. Due to the sluggish flow, with thrombus propagated. He really did not have any significant atherosclerosis to speak of. Therefore, Dr VIrish Lackallows him to have a higher LDL than usual, particularly if he is having side effects from statin therapy.  -- Continue  atorvastatin to 20 mg daily (does not want high dose statin)  -- Repeat lipid panel on this admission with TC 188; TG 263; HDL 39; LDL 96  Dispo- likely okay to go home today with close follow up in the office. If he continues to have chest pain or it becomes more bothersome, he may be willing to try ranolazine or imdur at that time.   The patient has had an uncomplicated hospital course and is recovering well. He has been seen by Dr.  Marlou Porch today and deemed ready for discharge home. All follow-up appointments have been scheduled.  Discharge medications are listed below.   Discharge Vitals: Blood pressure 102/66, pulse 58, temperature 97.7 F (36.5 C), temperature source Oral, resp. rate 16, height 6' (1.829 m), weight 173 lb 12.8 oz (78.835 kg), SpO2 95.00%.  Labs: Lab Results  Component Value Date   WBC 5.1 02/08/2014   HGB 14.3 02/08/2014   HCT 41.1 02/08/2014   MCV 84.6 02/08/2014   PLT 269 02/08/2014     Recent Labs Lab 02/08/14 0001  NA 142  K 3.7  CL 106  CO2 25  BUN 15  CREATININE 0.98  CALCIUM 9.1  PROT 6.9  BILITOT 0.4  ALKPHOS 70  ALT 31  AST 28  GLUCOSE 131*    Recent Labs  02/07/14 1910 02/08/14 0001 02/08/14 0550  TROPONINI <0.30 <0.30 <0.30   Lab Results  Component Value Date   CHOL 188 02/08/2014   HDL 39* 02/08/2014   LDLCALC 96 02/08/2014   TRIG 263* 02/08/2014     Diagnostic Studies/Procedures   Dg Chest 2 View  02/07/2014   ADDENDUM REPORT: 02/07/2014 13:59  ADDENDUM: Addendum to chest x-ray 02/07/2014. No power port is present. No acute cardiopulmonary disease present. Mild basilar atelectasis and/or scarring. Heart size normal. Thoracic spine scoliosis.   Electronically Signed   By: Marcello Moores  Register   On: 02/07/2014 13:59   02/07/2014   CLINICAL DATA:  Postop evaluation.  Prior PowerPort placement .  EXAM: CHEST  2 VIEW  COMPARISON:  01/02/2013.  FINDINGS: Prior port noted in good anatomic position. Mediastinum hilar structures normal. The lungs are clear. Heart size normal. No pleural effusion or pneumothorax. No acute bony abnormality.  IMPRESSION: Power port in good anatomic position. No acute cardiopulmonary disease.  Electronically Signed: ByMarcello Moores  Register On: 02/07/2014 13:06    Discharge Medications     Medication List         atorvastatin 20 MG tablet  Commonly known as:  LIPITOR  Take 20 mg by mouth daily.     clopidogrel 75 MG tablet    Commonly known as:  PLAVIX  Take 1 tablet (75 mg total) by mouth daily with breakfast.     DELZICOL 400 MG Cpdr DR capsule  Generic drug:  Mesalamine  Take 800 mg by mouth 2 (two) times daily.     multivitamin with minerals tablet  Take 1 tablet by mouth daily.     nitroGLYCERIN 0.4 MG SL tablet  Commonly known as:  NITROSTAT  Place 1 tablet (0.4 mg total) under the tongue every 5 (five) minutes as needed for chest pain.        Disposition   The patient will be discharged in stable condition to home.  Follow-up Information   Follow up with Jettie Booze., MD On 02/23/2014. (@ 8am)    Specialty:  Interventional Cardiology   Contact information:   [address]  [address] [phone]         Duration of Discharge Encounter: Greater than 30 minutes including physician and PA time.  SignedAngelena Form R PA-C 02/08/2014, 10:25 AM

## 2014-02-08 NOTE — Discharge Summary (Signed)
Personally seen and examined. Agree with above.Candee Furbish, MD

## 2014-02-08 NOTE — Progress Notes (Signed)
Discharge education completed by RN. Pt and spouse received a copy of discharge paperwork and confirm understanding of follow up appointments and discharge medications. Both deny any questions at this time. IV removed, not present at start of shift. Pt will discharge from the unit via wheelchair.

## 2014-02-12 NOTE — ED Provider Notes (Signed)
I saw and evaluated the patient, reviewed the resident's note and I agree with the findings and plan.   EKG Interpretation   Date/Time:  Tuesday February 07 2014 12:02:31 EDT Ventricular Rate:  57 PR Interval:  219 QRS Duration: 104 QT Interval:  441 QTC Calculation: 429 R Axis:   30 Text Interpretation:  Age not entered, assumed to be  53 years old for  purpose of ECG interpretation Sinus rhythm Prolonged PR interval  Inferolateral infarct, old Baseline wander in lead(s) V3 V4 ED PHYSICIAN  INTERPRETATION AVAILABLE IN [facility] Confirmed by TEST, Record  ([id number]) on 02/09/2014 7:04:18 AM      53yM with recurrent CP. Inferior MI earlier this year. Seen at OSH yesterday for similar pain. Reports recommended admission but pain resolved and he declined. Reoccurring pain today so came back to ED. EKG with inferior changes suspect from prior infarct. Will discuss with cards. Dispo per their recommendations.   Virgel Manifold, MD 02/12/14 [phone]

## 2014-02-23 ENCOUNTER — Encounter: Payer: Self-pay | Admitting: Interventional Cardiology

## 2014-02-23 ENCOUNTER — Ambulatory Visit (INDEPENDENT_AMBULATORY_CARE_PROVIDER_SITE_OTHER): Payer: 59 | Admitting: Interventional Cardiology

## 2014-02-23 VITALS — BP 100/69 | HR 75 | Ht 72.0 in | Wt 182.0 lb

## 2014-02-23 DIAGNOSIS — I25119 Atherosclerotic heart disease of native coronary artery with unspecified angina pectoris: Secondary | ICD-10-CM

## 2014-02-23 DIAGNOSIS — I213 ST elevation (STEMI) myocardial infarction of unspecified site: Secondary | ICD-10-CM

## 2014-02-23 DIAGNOSIS — I219 Acute myocardial infarction, unspecified: Secondary | ICD-10-CM

## 2014-02-23 DIAGNOSIS — E785 Hyperlipidemia, unspecified: Secondary | ICD-10-CM

## 2014-02-23 MED ORDER — ISOSORBIDE MONONITRATE ER 30 MG PO TB24: 30.0000 mg | ORAL_TABLET | Freq: Every day | ORAL | Status: DC

## 2014-02-23 NOTE — Progress Notes (Signed)
Patient ID: [name], male   DOB: 1960-09-07, 53 y.o.   MRN: [id number]    [address], [address] [place], [place]  [address] Phone: [phone] Fax:  [phone]  Date:  02/23/2014   ID:  [name], DOB November 23, 1960, MRN [id number]  PCP:  No primary provider on file.      History of Present Illness: Olive Motyka is a 53 y.o. 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  He was medically treated.  He had done well until early October 2015.  He walks regularly without chest pain.  No NTG usage.   He has had some lighheadedness an "thick tongued" speech on just a few occasions.  None since the last visit.    He wants to increase his activity level.  He has not tried to lose weight.   He was admitted to the hospital with chest pain in 10/15.  He ruled out for MI.  He was sent home.  Initially, he had gone to the [place] and came home.  THe next day- he had heartburn when he was drinking coffee (not unusual for him with drinking coffee). He went to the local ER and had a negative w/u. He felt fine until the next day while at work.  He had some CP that was similar to his MI pain. He took NTG with some relief after a few minutes.  He also had dizziness and sweating.    He was offered nitrates but declined.  He is back to exercising regularly.  He got HR to 125 and had no sx.  No problems in the last few weeks.     Wt Readings from Last 3 Encounters:  02/23/14 182 lb (82.555 kg)  02/08/14 173 lb 12.8 oz (78.835 kg)  12/29/13 180 lb (81.647 kg)     Past Medical History  Diagnosis Date  . Seizures     last seizure 26 years ago  . Cancer 05/29/2013    prostate  . Ulcerative colitis   . HLD (hyperlipidemia)   . Hematuria   . Junctional bradycardia   . CAD (coronary artery disease)     a. s/p inferior STEMI (07/2013)    Current Outpatient Prescriptions  Medication Sig Dispense Refill  . atorvastatin (LIPITOR) 20  MG tablet Take 20 mg by mouth daily.      . clopidogrel (PLAVIX) 75 MG tablet Take 1 tablet (75 mg total) by mouth daily with breakfast.  90 tablet  1  . DELZICOL 400 MG CPDR DR capsule Take 800 mg by mouth 2 (two) times daily.       . Multiple Vitamins-Minerals (MULTIVITAMIN WITH MINERALS) tablet Take 1 tablet by mouth daily.      . nitroGLYCERIN (NITROSTAT) 0.4 MG SL tablet Place 1 tablet (0.4 mg total) under the tongue every 5 (five) minutes as needed for chest pain.  25 tablet  12   No current facility-administered medications for this visit.    Allergies:   No Known Allergies  Social History:  The patient  reports that he quit smoking about 5 years ago. His smoking use included Cigars. He has never used smokeless tobacco. He reports that he drinks alcohol. He reports that he does not use illicit drugs.   Family History:  The patient's family history includes Deep vein thrombosis in his brother; Diabetes type II in his mother; Hepatitis C in his mother; Hypertension  in his brother, father, and mother. There is no history of Heart disease, Heart attack, or Stroke.   ROS:  Please see the history of present illness.  No nausea, vomiting.  No fevers, chills.  No focal weakness.  No dysuria.   All other systems reviewed and negative.   PHYSICAL EXAM: VS:  BP 100/69  Pulse 75  Ht 6' (1.829 m)  Wt 182 lb (82.555 kg)  BMI 24.68 kg/m2 Well nourished, well developed, in no acute distress HEENT: normal Neck: no JVD, no carotid bruits Cardiac:  normal S1, S2; RRR;  Lungs:  clear to auscultation bilaterally, no wheezing, rhonchi or rales Abd: soft, nontender, no hepatomegaly Ext: no edema Skin: warm and dry Neuro:   no focal abnormalities noted      ASSESSMENT AND PLAN:  1. Status post acute inferolateral ST elevation MI: He was treated with angioplasty but this result did not  last. He is now being treated medically. He has no clear angina. Unclear wihether pain he had was truly angina.   No objective evidence of ischemia. Continue aggressive medical therapy. It appears that his bradycardia has resolved. Holding off on low-dose beta blocker, because he has had lightheadedness with lower BP. However, his blood pressure is a little on the low side. We'll hold off for now and see how his blood pressure does.  Check echo to see EF.  Give Rx for Imdur 30 mg daily.  He will fill if CP comes back. 2. LDL was less than 100 in the hospital. Most recent LDL was 64. He has had some intermittent forgetfulness. Not clear that it is related to his statin but his cholesterol is well controlled. Decreased his atorvastatin to 20 mg daily. Recheck lipids in 2 months.  His MI was not the typical MI. He had a large aneurysmal RCA with sluggish flow at baseline. He likely had a dissection inside the vessel which caused thrombus. Due to the sluggish flow, with thrombus propagated. He really did not have any significant atherosclerosis to speak of. I would allow him to have a higher LDL than usual, particularly if he is having side effects from statin therapy. 3. Continue clopidogrel for now. He had some hematochezia, therefore, we stopped his aspirin.  4.  I encouraged him to increase his activity as tolerated. 5. We discussed further workup of these potential neurologic symptoms with carotid Doppler in the past. He has no bruits on exam. He would like to hold off. If there is any more episodic, mild thick tongued speech, would plan for carotid Doppler.  Signed, Mina Marble, MD, [facility], [place] 02/23/2014 8:25 AM

## 2014-02-23 NOTE — Patient Instructions (Signed)
Your physician has recommended you make the following change in your medication:   START TAKING IMDUR [address] [place] physician has requested that you have an echocardiogram. Echocardiography is a painless test that uses sound waves to create images of your heart. It provides your doctor with information about the size and shape of your heart and how well your heart's chambers and valves are working. This procedure takes approximately one hour. There are no restrictions for this procedure.   Your physician wants you to follow-up in: [place] will receive a reminder letter in the mail two months in advance. If you don't receive a letter, please call our office to schedule the follow-up appointment.

## 2014-02-28 ENCOUNTER — Ambulatory Visit (HOSPITAL_COMMUNITY): Payer: 59 | Attending: Interventional Cardiology

## 2014-02-28 DIAGNOSIS — E785 Hyperlipidemia, unspecified: Secondary | ICD-10-CM | POA: Insufficient documentation

## 2014-02-28 DIAGNOSIS — Z87891 Personal history of nicotine dependence: Secondary | ICD-10-CM | POA: Insufficient documentation

## 2014-02-28 DIAGNOSIS — I219 Acute myocardial infarction, unspecified: Secondary | ICD-10-CM

## 2014-02-28 DIAGNOSIS — I213 ST elevation (STEMI) myocardial infarction of unspecified site: Secondary | ICD-10-CM | POA: Insufficient documentation

## 2014-02-28 NOTE — Progress Notes (Signed)
2D Echo completed. 02/28/2014

## 2014-03-03 NOTE — Telephone Encounter (Signed)
New message   Patient calling stating someone called him this week returning call back.

## 2014-03-03 NOTE — Telephone Encounter (Signed)
The patient is aware of his results.

## 2014-03-06 ENCOUNTER — Other Ambulatory Visit (INDEPENDENT_AMBULATORY_CARE_PROVIDER_SITE_OTHER): Payer: 59 | Admitting: *Deleted

## 2014-03-06 DIAGNOSIS — E785 Hyperlipidemia, unspecified: Secondary | ICD-10-CM

## 2014-03-06 LAB — LIPID PANEL
CHOLESTEROL: 166 mg/dL (ref 0–200)
HDL: 39.3 mg/dL (ref >39.00)
LDL CALC: 102 mg/dL — AB (ref 0–99)
NonHDL: 126.7
TRIGLYCERIDES: 124 mg/dL (ref 0.0–149.0)
Total CHOL/HDL Ratio: 4
VLDL: 24.8 mg/dL (ref 0.0–40.0)

## 2014-03-06 LAB — HEPATIC FUNCTION PANEL
ALT: 46 U/L (ref 0–53)
AST: 49 U/L — AB (ref 0–37)
Albumin: 3.5 g/dL (ref 3.5–5.2)
Alkaline Phosphatase: 66 U/L (ref 39–117)
Bilirubin, Direct: 0 mg/dL (ref 0.0–0.3)
Total Bilirubin: 0.8 mg/dL (ref 0.2–1.2)
Total Protein: 7.5 g/dL (ref 6.0–8.3)

## 2014-03-08 ENCOUNTER — Telehealth: Payer: Self-pay | Admitting: Interventional Cardiology

## 2014-03-08 NOTE — Telephone Encounter (Signed)
Confirmation:    [facility] called for confirmation. Of fax received.   Ref # [id number]

## 2014-04-06 ENCOUNTER — Encounter (HOSPITAL_COMMUNITY): Payer: Self-pay | Admitting: Interventional Cardiology

## 2014-05-08 ENCOUNTER — Other Ambulatory Visit: Payer: Self-pay | Admitting: Interventional Cardiology

## 2014-07-03 ENCOUNTER — Other Ambulatory Visit: Payer: Self-pay | Admitting: Interventional Cardiology

## 2014-09-05 ENCOUNTER — Other Ambulatory Visit: Payer: Self-pay | Admitting: Interventional Cardiology

## 2014-10-01 ENCOUNTER — Other Ambulatory Visit: Payer: Self-pay | Admitting: Interventional Cardiology

## 2014-11-10 ENCOUNTER — Other Ambulatory Visit: Payer: 59

## 2014-11-16 ENCOUNTER — Other Ambulatory Visit: Payer: Self-pay | Admitting: Interventional Cardiology

## 2014-11-30 ENCOUNTER — Other Ambulatory Visit: Payer: Self-pay | Admitting: Interventional Cardiology

## 2014-12-04 ENCOUNTER — Other Ambulatory Visit: Payer: Self-pay | Admitting: Interventional Cardiology

## 2014-12-14 ENCOUNTER — Other Ambulatory Visit: Payer: Self-pay | Admitting: *Deleted

## 2014-12-14 MED ORDER — CLOPIDOGREL BISULFATE 75 MG PO TABS: ORAL_TABLET | ORAL | Status: DC

## 2015-01-02 ENCOUNTER — Ambulatory Visit (INDEPENDENT_AMBULATORY_CARE_PROVIDER_SITE_OTHER): Payer: Commercial Managed Care - HMO | Admitting: Interventional Cardiology

## 2015-01-02 ENCOUNTER — Encounter: Payer: Self-pay | Admitting: Interventional Cardiology

## 2015-01-02 VITALS — BP 118/84 | HR 54 | Ht 72.0 in | Wt 191.4 lb

## 2015-01-02 DIAGNOSIS — R001 Bradycardia, unspecified: Secondary | ICD-10-CM

## 2015-01-02 DIAGNOSIS — E785 Hyperlipidemia, unspecified: Secondary | ICD-10-CM | POA: Diagnosis not present

## 2015-01-02 DIAGNOSIS — I2510 Atherosclerotic heart disease of native coronary artery without angina pectoris: Secondary | ICD-10-CM

## 2015-01-02 DIAGNOSIS — I252 Old myocardial infarction: Secondary | ICD-10-CM

## 2015-01-02 MED ORDER — CLOPIDOGREL BISULFATE 75 MG PO TABS: ORAL_TABLET | ORAL | Status: DC

## 2015-01-02 NOTE — Patient Instructions (Signed)
**Note De-Identified  Obfuscation** Medication Instructions:  Same-no change  Labwork: CMET and Lipids tomorrow  Testing/Procedures: None  Follow-Up: Your physician wants you to follow-up in: 1 year. You will receive a reminder letter in the mail two months in advance. If you don't receive a letter, please call our office to schedule the follow-up appointment.

## 2015-01-02 NOTE — Progress Notes (Signed)
Patient ID: Dale Meadows, male   DOB: 22-Jan-1961, 54 y.o.   MRN: [id number]     Cardiology Office Note   Date:  01/03/2015   ID:  Dale Meadows, DOB Apr 12, 1961, MRN [id number]  PCP:  No PCP Per Patient    No chief complaint on file.    Wt Readings from Last 3 Encounters:  01/02/15 191 lb 6.4 oz (86.818 kg)  02/23/14 182 lb (82.555 kg)  02/08/14 173 lb 12.8 oz (78.835 kg)       History of Present Illness: Dale Meadows is a 54 y.o. male  whoi had an aneurysmal RCA that occluded causing inferior STEMI.  Unable to be opened successfully.  He is exercing reguarly, several times a week without any issues.  No NTG use.  At the time of cath, he had a patent left coronary artery system. There are left to right collaterals feeding the distal RCA territory.  Of note, the patient is moved to [place], [place], close to [place]. He continues follow-up here since he works in the [place] area.  Overall, he feels he is doing quite well. He will be looking for primary care physician closer to his residence.    Past Medical History  Diagnosis Date  . Seizures     last seizure 26 years ago  . Cancer 05/29/2013    prostate  . Ulcerative colitis   . HLD (hyperlipidemia)   . Hematuria   . Junctional bradycardia   . CAD (coronary artery disease)     a. s/p inferior STEMI (07/2013)    Past Surgical History  Procedure Laterality Date  . Biopsy prostate    . Left heart catheterization with coronary angiogram N/A 07/27/2013    Procedure: LEFT HEART CATHETERIZATION WITH CORONARY ANGIOGRAM;  Surgeon: Jettie Booze, MD;  Location: [facility] CATH LAB;  Service: Cardiovascular;  Laterality: N/A;  . Left heart catheterization with coronary angiogram N/A 07/29/2013    Procedure: LEFT HEART CATHETERIZATION WITH CORONARY ANGIOGRAM;  Surgeon: Sinclair Grooms, MD;  Location: [facility] CATH LAB;  Service: Cardiovascular;  Laterality: N/A;     Current Outpatient Prescriptions  Medication Sig  Dispense Refill  . atorvastatin (LIPITOR) 20 MG tablet Take 20 mg by mouth daily.    . clopidogrel (PLAVIX) 75 MG tablet TAKE 1 TABLET BY MOUTH DAILY WITH BREAKFAST 90 tablet 3  . LIALDA 1.2 G EC tablet     . NITROSTAT 0.4 MG SL tablet PLACE ONE TABLET UNDER THE TONGUE EVERY 5 MINUTES AS NEEDED FOR CHEST PAIN. 25 tablet 1   No current facility-administered medications for this visit.    Allergies:   Review of patient's allergies indicates no known allergies.    Social History:  The patient  reports that he quit smoking about 6 years ago. His smoking use included Cigars. He has never used smokeless tobacco. He reports that he drinks alcohol. He reports that he does not use illicit drugs.   Family History:  The patient's family history includes Deep vein thrombosis in his brother; Diabetes type II in his mother; Hepatitis C in his mother; Hypertension in his brother, father, and mother. There is no history of Heart disease, Heart attack, or Stroke.    ROS:  Please see the history of present illness.   Otherwise, review of systems are positive for weight gain.   All other systems are reviewed and negative.    PHYSICAL EXAM: VS:  BP 118/84 mmHg  Pulse 54  Ht 6' (1.829 m)  Wt 191 lb 6.4 oz (86.818 kg)  BMI 25.95 kg/m2 , BMI Body mass index is 25.95 kg/(m^2). GEN: Well nourished, well developed, in no acute distress HEENT: normal Neck: no JVD, carotid bruits, or masses Cardiac: RRR; no murmurs, rubs, or gallops,no edema  Respiratory:  clear to auscultation bilaterally, normal work of breathing GI: soft, nontender, nondistended, + BS MS: no deformity or atrophy Skin: warm and dry, no rash Neuro:  Strength and sensation are intact Psych: euthymic mood, full affect   EKG:   The ekg ordered today demonstrates normal sinus rhythm, inferior Q waves   Recent Labs: 02/07/2014: TSH 1.370 02/08/2014: Hemoglobin 14.3; Platelets 269 01/03/2015: ALT 23; BUN 18; Creatinine, Ser 0.87; Potassium  4.2; Sodium 140   Lipid Panel    Component Value Date/Time   CHOL 160 01/03/2015 0817   TRIG 84.0 01/03/2015 0817   HDL 41.20 01/03/2015 0817   CHOLHDL 4 01/03/2015 0817   VLDL 16.8 01/03/2015 0817   LDLCALC 102* 01/03/2015 0817     Other studies Reviewed: Additional studies/ records that were reviewed today with results demonstrating: Cath Lab report reviewed, results noted above.   ASSESSMENT AND PLAN:  1. CAD/s/p inferior MI: Doing well. No angina. Continue aggressive secondary prevention. His MI was likely more due to the aneurysmal right coronary artery. There was not much lipid plaque in the vessels that was noted. We'll still try to keep his LDL closer to 70. 2. Check lipids and CMet when fasting.   Current medicines are reviewed at length with the patient today.  The patient concerns regarding his medicines were addressed.  The following changes have been made:  No change  Labs/ tests ordered today include:   Orders Placed This Encounter  Procedures  . Lipid Profile  . Comp Met (CMET)  . EKG 12-Lead    Recommend 150 minutes/week of aerobic exercise Low fat, low carb, high fiber diet recommended  Disposition:   FU in one year   Teresita Madura., MD  01/03/2015 11:05 AM    [facility] [employer], [place], [place]  [address] Phone: [phone]; Fax: [phone]

## 2015-01-03 ENCOUNTER — Other Ambulatory Visit (INDEPENDENT_AMBULATORY_CARE_PROVIDER_SITE_OTHER): Payer: Commercial Managed Care - HMO | Admitting: *Deleted

## 2015-01-03 DIAGNOSIS — E785 Hyperlipidemia, unspecified: Secondary | ICD-10-CM

## 2015-01-03 DIAGNOSIS — I252 Old myocardial infarction: Secondary | ICD-10-CM | POA: Insufficient documentation

## 2015-01-03 LAB — COMPREHENSIVE METABOLIC PANEL
ALBUMIN: 4 g/dL (ref 3.5–5.2)
ALT: 23 U/L (ref 0–53)
AST: 22 U/L (ref 0–37)
Alkaline Phosphatase: 59 U/L (ref 39–117)
BUN: 18 mg/dL (ref 6–23)
CHLORIDE: 108 meq/L (ref 96–112)
CO2: 25 meq/L (ref 19–32)
CREATININE: 0.87 mg/dL (ref 0.40–1.50)
Calcium: 9.1 mg/dL (ref 8.4–10.5)
GFR: 96.3 mL/min (ref >60.00)
GLUCOSE: 90 mg/dL (ref 70–99)
Potassium: 4.2 mEq/L (ref 3.5–5.1)
SODIUM: 140 meq/L (ref 135–145)
Total Bilirubin: 0.7 mg/dL (ref 0.2–1.2)
Total Protein: 7 g/dL (ref 6.0–8.3)

## 2015-01-03 LAB — LIPID PANEL
CHOLESTEROL: 160 mg/dL (ref 0–200)
HDL: 41.2 mg/dL (ref >39.00)
LDL CALC: 102 mg/dL — AB (ref 0–99)
NONHDL: 118.63
Total CHOL/HDL Ratio: 4
Triglycerides: 84 mg/dL (ref 0.0–149.0)
VLDL: 16.8 mg/dL (ref 0.0–40.0)

## 2015-01-05 ENCOUNTER — Telehealth: Payer: Self-pay

## 2015-01-05 NOTE — Telephone Encounter (Signed)
-----   Message from Jettie Booze, MD sent at 01/04/2015  5:33 PM EDT ----- Lipids, liver and electrolytes well controlled

## 2015-01-05 NOTE — Telephone Encounter (Signed)
spoke with patient about recent lab results. pt verbalized understanding and will continue with current treatment plan.

## 2015-05-14 ENCOUNTER — Other Ambulatory Visit: Payer: Self-pay | Admitting: Interventional Cardiology

## 2015-07-23 ENCOUNTER — Other Ambulatory Visit: Payer: Self-pay | Admitting: *Deleted

## 2015-07-23 ENCOUNTER — Other Ambulatory Visit: Payer: Self-pay | Admitting: Interventional Cardiology

## 2015-07-23 MED ORDER — CLOPIDOGREL BISULFATE 75 MG PO TABS: ORAL_TABLET | ORAL | Status: DC

## 2015-07-23 MED ORDER — ATORVASTATIN CALCIUM 20 MG PO TABS: 20.0000 mg | ORAL_TABLET | Freq: Every day | ORAL | Status: DC

## 2015-07-23 NOTE — Telephone Encounter (Signed)
Per voicemail from patient, he changed insurance companies and as a result of this [facility] will need new rx's for clopidogrel and atorvastatin. New rx's sent.

## 2015-11-22 ENCOUNTER — Other Ambulatory Visit: Payer: Self-pay

## 2015-11-22 MED ORDER — ATORVASTATIN CALCIUM 20 MG PO TABS: 20.0000 mg | ORAL_TABLET | Freq: Every day | ORAL | 0 refills | Status: DC

## 2015-11-22 MED ORDER — CLOPIDOGREL BISULFATE 75 MG PO TABS: ORAL_TABLET | ORAL | 0 refills | Status: DC

## 2016-02-20 ENCOUNTER — Other Ambulatory Visit: Payer: Self-pay | Admitting: Interventional Cardiology

## 2016-05-23 ENCOUNTER — Encounter: Payer: Self-pay | Admitting: *Deleted

## 2016-05-23 NOTE — Progress Notes (Signed)
Patient ID: Dale Meadows, male   DOB: 06/20/1960, 56 y.o.   MRN: [id number]     Cardiology Office Note   Date:  05/26/2016   ID:  Dale Meadows, DOB 18-Aug-1960, MRN [id number]  PCP:  No PCP Per Patient    No chief complaint on file. f/u CAD   Wt Readings from Last 3 Encounters:  05/26/16 193 lb 9.6 oz (87.8 kg)  01/02/15 191 lb 6.4 oz (86.8 kg)  02/23/14 182 lb (82.6 kg)       History of Present Illness: Dale Meadows is a 56 y.o. 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  Unable to be opened successfully.  He is no longer exercing reguarly; he got busy with the move back to [place].  No NTG use.  No Sx like his MI.   At the time of cath in 2015, he had a patent left coronary artery system. There are left to right collaterals feeding the distal RCA territory.  He moved back to [place], [place], from [place]. He had been laid off in [place].  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    He has some DOE.  He has not been walking as much.  No edema.  No orthopnea.  He thinks it is lack of conditioning.  He had been walking more before.  Weight has been stable.      Past Medical History:  Diagnosis Date  . CAD (coronary artery disease)    a. s/p inferior STEMI (07/2013)  . Cancer ([place]) 05/29/2013   prostate  . Hematuria   . HLD (hyperlipidemia)   . Junctional bradycardia   . Seizures ([place])    last seizure 26 years ago  . Ulcerative colitis [facility])     Past Surgical History:  Procedure Laterality Date  . BIOPSY PROSTATE    . LEFT HEART CATHETERIZATION WITH CORONARY ANGIOGRAM N/A 07/27/2013   Procedure: LEFT HEART CATHETERIZATION WITH CORONARY ANGIOGRAM;  Surgeon: Jettie Booze, MD;  Location: [facility] CATH LAB;  Service: Cardiovascular;  Laterality: N/A;  . LEFT HEART CATHETERIZATION WITH CORONARY ANGIOGRAM N/A 07/29/2013   Procedure: LEFT HEART CATHETERIZATION WITH CORONARY ANGIOGRAM;  Surgeon: Sinclair Grooms, MD;   Location: [facility] CATH LAB;  Service: Cardiovascular;  Laterality: N/A;     Current Outpatient Prescriptions  Medication Sig Dispense Refill  . atorvastatin (LIPITOR) 20 MG tablet TAKE 1 TABLET BY MOUTH DAILY 90 tablet 0  . clopidogrel (PLAVIX) 75 MG tablet TAKE 1 TABLET BY MOUTH DAILY WITH BREAKFAST 90 tablet 0  . LIALDA 1.2 G EC tablet Take 1.2 g by mouth daily with breakfast.     . NITROSTAT 0.4 MG SL tablet PLACE ONE TABLET UNDER THE TONGUE EVERY 5 MINUTES AS NEEDED FOR CHEST PAIN. 25 tablet 1   No current facility-administered medications for this visit.     Allergies:   Patient has no known allergies.    Social History:  The patient  reports that he quit smoking about 7 years ago. His smoking use included Cigars. He has never used smokeless tobacco. He reports that he drinks alcohol. He reports that he does not use drugs.   Family History:  The patient's family history includes Deep vein thrombosis in his brother; Diabetes type II in his mother; Hepatitis C in his mother; Hypertension in his brother, father, and mother.    ROS:  Please see the history of present illness.   Otherwise, review  of systems are positive for decreased exercise..   All other systems are reviewed and negative.    PHYSICAL EXAM: VS:  BP 112/68   Pulse 68   Ht 6' (1.829 m)   Wt 193 lb 9.6 oz (87.8 kg)   BMI 26.26 kg/m  , BMI Body mass index is 26.26 kg/m. GEN: Well nourished, well developed, in no acute distress  HEENT: normal  Neck: no JVD, carotid bruits, or masses Cardiac: RRR; no murmurs, rubs, or gallops,no edema  Respiratory:  clear to auscultation bilaterally, normal work of breathing GI: soft, nontender, nondistended, + BS MS: no deformity or atrophy  Skin: warm and dry, no rash Neuro:  Strength and sensation are intact Psych: euthymic mood, full affect   EKG:   The ekg ordered today demonstrates normal sinus rhythm, inferior Q waves; no further junctional rhythm   Recent Labs: No  results found for requested labs within last 8760 hours.   Lipid Panel    Component Value Date/Time   CHOL 160 01/03/2015 0817   TRIG 84.0 01/03/2015 0817   HDL 41.20 01/03/2015 0817   CHOLHDL 4 01/03/2015 0817   VLDL 16.8 01/03/2015 0817   LDLCALC 102 (H) 01/03/2015 0817     Other studies Reviewed: Additional studies/ records that were reviewed today with results demonstrating: Cath Lab report reviewed, results noted above.   ASSESSMENT AND PLAN:  1. CAD/s/p inferior MI: Doing well. No angina. Continue aggressive secondary prevention. His MI was likely more due to the aneurysmal right coronary artery. There was not much lipid plaque in the vessels that was noted. We'll still try to keep his LDL closer to 70. 2. Check lipids and CMet today. 3. DOE: Likely related to decreased stamina from lack of exercise. I encouraged him to try to get back to regular walking. He does have a treadmill at home.   Current medicines are reviewed at length with the patient today.  The patient concerns regarding his medicines were addressed.  The following changes have been made:  No change  Labs/ tests ordered today include:   Orders Placed This Encounter  Procedures  . EKG 12-Lead    Recommend 150 minutes/week of aerobic exercise Low fat, low carb, high fiber diet recommended  Disposition:   FU in one year   Signed, Larae Grooms, MD  05/26/2016 9:25 AM    [facility], [place], [place]  [address] Phone: [phone]; Fax: [phone]

## 2016-05-26 ENCOUNTER — Encounter (INDEPENDENT_AMBULATORY_CARE_PROVIDER_SITE_OTHER): Payer: Self-pay

## 2016-05-26 ENCOUNTER — Encounter: Payer: Self-pay | Admitting: Interventional Cardiology

## 2016-05-26 ENCOUNTER — Ambulatory Visit (INDEPENDENT_AMBULATORY_CARE_PROVIDER_SITE_OTHER): Payer: Managed Care, Other (non HMO) | Admitting: Interventional Cardiology

## 2016-05-26 VITALS — BP 112/68 | HR 68 | Ht 72.0 in | Wt 193.6 lb

## 2016-05-26 DIAGNOSIS — I252 Old myocardial infarction: Secondary | ICD-10-CM | POA: Diagnosis not present

## 2016-05-26 DIAGNOSIS — I2510 Atherosclerotic heart disease of native coronary artery without angina pectoris: Secondary | ICD-10-CM

## 2016-05-26 DIAGNOSIS — E782 Mixed hyperlipidemia: Secondary | ICD-10-CM | POA: Diagnosis not present

## 2016-05-26 DIAGNOSIS — R001 Bradycardia, unspecified: Secondary | ICD-10-CM | POA: Diagnosis not present

## 2016-05-26 DIAGNOSIS — R0609 Other forms of dyspnea: Secondary | ICD-10-CM

## 2016-05-26 NOTE — Patient Instructions (Signed)
Medication Instructions:  Same-no changes  Labwork: CMET and Lipids today  Testing/Procedures: None  Follow-Up: Your physician wants you to follow-up in: 1 year. You will receive a reminder letter in the mail two months in advance. If you don't receive a letter, please call our office to schedule the follow-up appointment.     If you need a refill on your cardiac medications before your next appointment, please call your pharmacy.

## 2016-05-27 LAB — COMPREHENSIVE METABOLIC PANEL
ALBUMIN: 4.2 g/dL (ref 3.5–5.5)
ALK PHOS: 89 IU/L (ref 39–117)
ALT: 36 IU/L (ref 0–44)
AST: 22 IU/L (ref 0–40)
Albumin/Globulin Ratio: 1.6 (ref 1.2–2.2)
BILIRUBIN TOTAL: 0.6 mg/dL (ref 0.0–1.2)
BUN / CREAT RATIO: 14 (ref 9–20)
BUN: 12 mg/dL (ref 6–24)
CHLORIDE: 103 mmol/L (ref 96–106)
CO2: 25 mmol/L (ref 18–29)
Calcium: 9.1 mg/dL (ref 8.7–10.2)
Creatinine, Ser: 0.86 mg/dL (ref 0.76–1.27)
GFR calc Af Amer: 111 mL/min/{1.73_m2} (ref >59)
GFR calc non Af Amer: 96 mL/min/{1.73_m2} (ref >59)
GLOBULIN, TOTAL: 2.7 g/dL (ref 1.5–4.5)
Glucose: 84 mg/dL (ref 65–99)
Potassium: 4.3 mmol/L (ref 3.5–5.2)
SODIUM: 140 mmol/L (ref 134–144)
Total Protein: 6.9 g/dL (ref 6.0–8.5)

## 2016-05-27 LAB — LIPID PANEL
CHOLESTEROL TOTAL: 171 mg/dL (ref 100–199)
Chol/HDL Ratio: 4.5 ratio units (ref 0.0–5.0)
HDL: 38 mg/dL — ABNORMAL LOW (ref >39)
LDL Calculated: 109 mg/dL — ABNORMAL HIGH (ref 0–99)
Triglycerides: 122 mg/dL (ref 0–149)
VLDL Cholesterol Cal: 24 mg/dL (ref 5–40)

## 2016-06-02 ENCOUNTER — Other Ambulatory Visit: Payer: Self-pay | Admitting: Interventional Cardiology

## 2016-11-18 ENCOUNTER — Other Ambulatory Visit: Payer: Self-pay | Admitting: *Deleted

## 2016-11-18 MED ORDER — CLOPIDOGREL BISULFATE 75 MG PO TABS: 75.0000 mg | ORAL_TABLET | Freq: Every day | ORAL | 3 refills | Status: DC

## 2016-11-18 MED ORDER — NITROGLYCERIN 0.4 MG SL SUBL: 0.4000 mg | SUBLINGUAL_TABLET | SUBLINGUAL | 3 refills | Status: DC | PRN

## 2016-11-18 MED ORDER — ATORVASTATIN CALCIUM 20 MG PO TABS: 20.0000 mg | ORAL_TABLET | Freq: Every day | ORAL | 3 refills | Status: DC

## 2017-02-26 ENCOUNTER — Telehealth: Payer: Self-pay

## 2017-02-26 NOTE — Telephone Encounter (Signed)
    Chart reviewed as part of pre-operative protocol coverage. Because of Donnelle Arington's past medical history and time since last visit (>6 months), he/she will require a follow-up visit in order to better assess preoperative cardiovascular risk and assess safety of coming off Plavix for test. RCRI was calculated at 6.6%. Although a low risk procedure, he also had dyspnea at last OV and with history of LVEF (not currently on any BB/ACEI/ARB) this needs to be re-evaluated rather than clearing over phone.  Pre-op covering staff: - Please schedule appointment and call patient to inform them. Per request, colonoscopy is not until December. - Please contact requesting surgeon's office via preferred method (i.e, phone, fax) to inform them of need for appointment prior to surgery.  Charlie Pitter, PA-C  02/26/2017, 3:23 PM

## 2017-02-26 NOTE — Telephone Encounter (Signed)
Called pt re: cardiac clearance for Colonoscopy and have made him aware that he would need to be seen before we can clear him for his procedure. Pt has been scheduled to see Melina Copa, PA-C 03/27/17. Pt thanked me for the call.

## 2017-02-26 NOTE — Telephone Encounter (Signed)
   [facility] Pre-operative Risk Assessment    Request for surgical clearance:  1. What type of surgery is being performed? Colonoscopy   2. When is this surgery scheduled? 04/16/2017   3. Are there any medications that need to be held prior to surgery and how long? Requesting to hold Plavix 2 days prior to procedure   4. Practice name and name of physician performing surgery? [facility] // Dr/ Oletta Lamas   5. What is your office phone and fax number?  1. Phone: [phone] 2. Fax: [phone]   6. Anesthesia type (None, local, MAC, general) ?  Not specified

## 2017-03-27 ENCOUNTER — Ambulatory Visit: Payer: Managed Care, Other (non HMO) | Admitting: Physician Assistant

## 2017-04-30 NOTE — Progress Notes (Signed)
Cardiology Office Note   Date:  05/04/2017   ID:  Dale Meadows, DOB 11-Jan-1961, MRN [id number]  PCP:  Patient, No Pcp Per    No chief complaint on file. Old MI   Wt Readings from Last 3 Encounters:  05/04/17 199 lb 3.2 oz (90.4 kg)  05/26/16 193 lb 9.6 oz (87.8 kg)  01/02/15 191 lb 6.4 oz (86.8 kg)       History of Present Illness: Dale Meadows is a 57 y.o. 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  Unable to be opened successfully.   At the time of cath in 2015, he had a patent left coronary artery system. There are left to right collaterals feeding the distal RCA territory.  He moved back to [place], [place], from [place]. He had been laid off in [place].  He retired, but worked part time for a not for [employer].  In 2018, he fully retired.    Denies : Chest pain. Dizziness. Leg edema. Nitroglycerin use. Orthopnea. Palpitations. Paroxysmal nocturnal dyspnea. Shortness of breath. Syncope.   He is not exercising as much as he should.        Past Medical History:  Diagnosis Date  . CAD (coronary artery disease)    a. s/p inferior STEMI (07/2013)  . Cancer ([place]) 05/29/2013   prostate  . Hematuria   . HLD (hyperlipidemia)   . Junctional bradycardia   . Seizures ([place])    last seizure 26 years ago  . Ulcerative colitis [facility])     Past Surgical History:  Procedure Laterality Date  . BIOPSY PROSTATE    . LEFT HEART CATHETERIZATION WITH CORONARY ANGIOGRAM N/A 07/27/2013   Procedure: LEFT HEART CATHETERIZATION WITH CORONARY ANGIOGRAM;  Surgeon: Jettie Booze, MD;  Location: [facility] C[facility] LAB;  Service: Cardiovascular;  Laterality: N/A;  . LEFT HEART CATHETERIZATION WITH CORONARY ANGIOGRAM N/A 07/29/2013   Procedure: LEFT HEART CATHETERIZATION WITH CORONARY ANGIOGRAM;  Surgeon: Sinclair Grooms, MD;  Location: [facility] CATH LAB;  Service: Cardiovascular;  Laterality: N/A;     Current Outpatient Medications  Medication Sig Dispense  Refill  . atorvastatin (LIPITOR) 20 MG tablet Take 1 tablet (20 mg total) by mouth daily. 90 tablet 3  . clopidogrel (PLAVIX) 75 MG tablet Take 1 tablet (75 mg total) by mouth daily with breakfast. 90 tablet 3  . LIALDA 1.2 G EC tablet Take 1.2 g by mouth daily with breakfast.     . nitroGLYCERIN (NITROSTAT) 0.4 MG SL tablet Place 1 tablet (0.4 mg total) under the tongue every 5 (five) minutes x 3 doses as needed for chest pain. 25 tablet 3   No current facility-administered medications for this visit.     Allergies:   Patient has no known allergies.    Social History:  The patient  reports that he quit smoking about 8 years ago. His smoking use included cigars. he has never used smokeless tobacco. He reports that he drinks alcohol. He reports that he does not use drugs.   Family History:  The patient's family history includes Deep vein thrombosis in his brother; Diabetes type II in his mother; Hepatitis C in his mother; Hypertension in his brother, father, and mother.    ROS:  Please see the history of present illness.   Otherwise, review of systems are positive for upcoming colonocopy.   All other systems are reviewed and negative.    PHYSICAL EXAM: VS:  BP 106/74   Pulse  73   Ht 6' (1.829 m)   Wt 199 lb 3.2 oz (90.4 kg)   SpO2 93%   BMI 27.02 kg/m  , BMI Body mass index is 27.02 kg/m. GEN: Well nourished, well developed, in no acute distress  HEENT: normal  Neck: no JVD, carotid bruits, or masses Cardiac: RRR; no murmurs, rubs, or gallops,no edema  Respiratory:  clear to auscultation bilaterally, normal work of breathing GI: soft, nontender, nondistended, + BS MS: no deformity or atrophy  Skin: warm and dry, no rash Neuro:  Strength and sensation are intact Psych: euthymic mood, full affect   EKG:   The ekg ordered today demonstrates NSR, inferior Q waves, no ST changes   Recent Labs: 05/26/2016: ALT 36; BUN 12; Creatinine, Ser 0.86; Potassium 4.3; Sodium 140   Lipid  Panel    Component Value Date/Time   CHOL 171 05/26/2016 0945   TRIG 122 05/26/2016 0945   HDL 38 (L) 05/26/2016 0945   CHOLHDL 4.5 05/26/2016 0945   CHOLHDL 4 01/03/2015 0817   VLDL 16.8 01/03/2015 0817   LDLCALC 109 (H) 05/26/2016 0945     Other studies Reviewed: Additional studies/ records that were reviewed today with results demonstrating: lipids as noted above .   ASSESSMENT AND PLAN:  1. Old MI/CAD: Prior inferior MI that could not be treated due to aneurysmal coronary artery. Collaterals developed.  2. Hyperlipidemia: LDL controlled last year.  Will check lipids and other labs.   3. Ulcerative colitis:  OK to hold Plavix for upcoming colonoscopy.     Current medicines are reviewed at length with the patient today.  The patient concerns regarding his medicines were addressed.  The following changes have been made:  No change  Labs/ tests ordered today include:  No orders of the defined types were placed in this encounter.   Recommend 150 minutes/week of aerobic exercise Low fat, low carb, high fiber diet recommended  Disposition:   FU in 1 year   Signed, Larae Grooms, MD  05/04/2017 4:08 PM    [facility] [place], [place], [place]  [address] Phone: [phone]; Fax: [phone]

## 2017-05-04 ENCOUNTER — Encounter: Payer: Self-pay | Admitting: Interventional Cardiology

## 2017-05-04 ENCOUNTER — Encounter (INDEPENDENT_AMBULATORY_CARE_PROVIDER_SITE_OTHER): Payer: Self-pay

## 2017-05-04 ENCOUNTER — Ambulatory Visit: Payer: 59 | Admitting: Interventional Cardiology

## 2017-05-04 VITALS — BP 106/74 | HR 73 | Ht 72.0 in | Wt 199.2 lb

## 2017-05-04 DIAGNOSIS — I2510 Atherosclerotic heart disease of native coronary artery without angina pectoris: Secondary | ICD-10-CM | POA: Diagnosis not present

## 2017-05-04 DIAGNOSIS — E782 Mixed hyperlipidemia: Secondary | ICD-10-CM | POA: Diagnosis not present

## 2017-05-04 DIAGNOSIS — R001 Bradycardia, unspecified: Secondary | ICD-10-CM

## 2017-05-04 DIAGNOSIS — I252 Old myocardial infarction: Secondary | ICD-10-CM

## 2017-05-04 NOTE — Patient Instructions (Signed)
Medication Instructions:  Your physician recommends that you continue on your current medications as directed. Please refer to the Current Medication list given to you today.   Labwork: Your physician recommends that you return for FASTING lab work: CMET, LIPIDS   Testing/Procedures: None ordered  Follow-Up: Your physician wants you to follow-up in: 1 year with Dr. Irish Lack. You will receive a reminder letter in the mail two months in advance. If you don't receive a letter, please call our office to schedule the follow-up appointment.   Any Other Special Instructions Will Be Listed Below (If Applicable).     If you need a refill on your cardiac medications before your next appointment, please call your pharmacy.

## 2017-05-05 ENCOUNTER — Other Ambulatory Visit: Payer: 59

## 2017-05-05 DIAGNOSIS — E782 Mixed hyperlipidemia: Secondary | ICD-10-CM

## 2017-05-05 DIAGNOSIS — I2510 Atherosclerotic heart disease of native coronary artery without angina pectoris: Secondary | ICD-10-CM

## 2017-05-05 LAB — LIPID PANEL
CHOLESTEROL TOTAL: 210 mg/dL — AB (ref 100–199)
Chol/HDL Ratio: 4.9 ratio (ref 0.0–5.0)
HDL: 43 mg/dL (ref >39)
LDL CALC: 139 mg/dL — AB (ref 0–99)
TRIGLYCERIDES: 139 mg/dL (ref 0–149)
VLDL CHOLESTEROL CAL: 28 mg/dL (ref 5–40)

## 2017-05-05 LAB — COMPREHENSIVE METABOLIC PANEL
ALBUMIN: 4.2 g/dL (ref 3.5–5.5)
ALK PHOS: 76 IU/L (ref 39–117)
ALT: 40 IU/L (ref 0–44)
AST: 46 IU/L — ABNORMAL HIGH (ref 0–40)
Albumin/Globulin Ratio: 1.4 (ref 1.2–2.2)
BILIRUBIN TOTAL: 0.6 mg/dL (ref 0.0–1.2)
BUN / CREAT RATIO: 19 (ref 9–20)
BUN: 15 mg/dL (ref 6–24)
CHLORIDE: 104 mmol/L (ref 96–106)
CO2: 21 mmol/L (ref 20–29)
CREATININE: 0.79 mg/dL (ref 0.76–1.27)
Calcium: 9.2 mg/dL (ref 8.7–10.2)
GFR calc Af Amer: 114 mL/min/{1.73_m2} (ref >59)
GFR calc non Af Amer: 99 mL/min/{1.73_m2} (ref >59)
GLUCOSE: 88 mg/dL (ref 65–99)
Globulin, Total: 2.9 g/dL (ref 1.5–4.5)
Potassium: 4.4 mmol/L (ref 3.5–5.2)
Sodium: 140 mmol/L (ref 134–144)
Total Protein: 7.1 g/dL (ref 6.0–8.5)

## 2017-05-07 ENCOUNTER — Telehealth: Payer: Self-pay

## 2017-05-07 DIAGNOSIS — E785 Hyperlipidemia, unspecified: Secondary | ICD-10-CM

## 2017-05-07 NOTE — Telephone Encounter (Signed)
-----   Message from Jettie Booze, MD sent at 05/06/2017 11:32 AM EST ----- Slight increase in ALT and LDL.  Repeat LFTs and liver tests in 6 months.  Recommend healthy diet and exercise.

## 2017-05-07 NOTE — Telephone Encounter (Signed)
Patient made aware of his results and recommendation to maintain a healthy diet, get regular exercise and repeat labs in 6 months. Patient verbalizes understanding and is in agreement with this plan. LIPIDS and LFTS ordered and appointment made for 11/04/17.

## 2017-05-08 ENCOUNTER — Encounter: Payer: Self-pay | Admitting: Interventional Cardiology

## 2017-05-12 NOTE — Addendum Note (Signed)
Addended by: Jacinta Shoe on: 05/12/2017 07:59 AM   Modules accepted: Orders

## 2017-05-12 NOTE — Addendum Note (Signed)
Addended by: Jacinta Shoe on: 05/12/2017 08:02 AM   Modules accepted: Orders

## 2017-05-26 ENCOUNTER — Ambulatory Visit: Payer: Managed Care, Other (non HMO) | Admitting: Interventional Cardiology

## 2017-09-19 ENCOUNTER — Other Ambulatory Visit: Payer: Self-pay | Admitting: Interventional Cardiology

## 2017-11-04 ENCOUNTER — Encounter (INDEPENDENT_AMBULATORY_CARE_PROVIDER_SITE_OTHER): Payer: Self-pay

## 2017-11-04 ENCOUNTER — Other Ambulatory Visit: Payer: 59

## 2017-11-04 DIAGNOSIS — E785 Hyperlipidemia, unspecified: Secondary | ICD-10-CM

## 2017-11-04 LAB — HEPATIC FUNCTION PANEL
ALBUMIN: 4.5 g/dL (ref 3.5–5.5)
ALT: 26 IU/L (ref 0–44)
AST: 22 IU/L (ref 0–40)
Alkaline Phosphatase: 76 IU/L (ref 39–117)
Bilirubin Total: 0.6 mg/dL (ref 0.0–1.2)
Bilirubin, Direct: 0.14 mg/dL (ref 0.00–0.40)
Total Protein: 7.1 g/dL (ref 6.0–8.5)

## 2017-11-04 LAB — LIPID PANEL
CHOL/HDL RATIO: 4.9 ratio (ref 0.0–5.0)
CHOLESTEROL TOTAL: 199 mg/dL (ref 100–199)
HDL: 41 mg/dL (ref >39)
LDL Calculated: 134 mg/dL — ABNORMAL HIGH (ref 0–99)
TRIGLYCERIDES: 122 mg/dL (ref 0–149)
VLDL Cholesterol Cal: 24 mg/dL (ref 5–40)

## 2017-11-12 ENCOUNTER — Telehealth: Payer: Self-pay

## 2017-11-12 DIAGNOSIS — Z79899 Other long term (current) drug therapy: Secondary | ICD-10-CM

## 2017-11-12 DIAGNOSIS — E785 Hyperlipidemia, unspecified: Secondary | ICD-10-CM

## 2017-11-12 MED ORDER — ATORVASTATIN CALCIUM 40 MG PO TABS: 40.0000 mg | ORAL_TABLET | Freq: Every day | ORAL | 3 refills | Status: DC

## 2017-11-12 NOTE — Telephone Encounter (Signed)
Spoke with patient, he expressed understanding about the lab results and accepted the medication dose change of atorvastatin to 40 mg a day. The labs were ordered and scheduled for 02/12/18.   Notes recorded by Jettie Booze, MD on 11/12/2017 at 10:53 AM EDT Liver tests ok. LDL slightly above target. Increase atorvastatin to 40 mg daily. WOuld like to see LDL < 100. Repeat labs in 3 months

## 2017-12-01 ENCOUNTER — Other Ambulatory Visit: Payer: Self-pay | Admitting: Interventional Cardiology

## 2018-01-24 ENCOUNTER — Other Ambulatory Visit: Payer: Self-pay | Admitting: Interventional Cardiology

## 2018-02-09 ENCOUNTER — Other Ambulatory Visit: Payer: Self-pay | Admitting: Interventional Cardiology

## 2018-02-11 ENCOUNTER — Other Ambulatory Visit: Payer: 59 | Admitting: *Deleted

## 2018-02-11 DIAGNOSIS — Z79899 Other long term (current) drug therapy: Secondary | ICD-10-CM

## 2018-02-11 DIAGNOSIS — E785 Hyperlipidemia, unspecified: Secondary | ICD-10-CM

## 2018-02-11 LAB — LIPID PANEL
CHOLESTEROL TOTAL: 174 mg/dL (ref 100–199)
Chol/HDL Ratio: 3.9 ratio (ref 0.0–5.0)
HDL: 45 mg/dL (ref >39)
LDL Calculated: 105 mg/dL — ABNORMAL HIGH (ref 0–99)
TRIGLYCERIDES: 122 mg/dL (ref 0–149)
VLDL CHOLESTEROL CAL: 24 mg/dL (ref 5–40)

## 2018-02-11 LAB — HEPATIC FUNCTION PANEL
ALK PHOS: 80 IU/L (ref 39–117)
ALT: 29 IU/L (ref 0–44)
AST: 22 IU/L (ref 0–40)
Albumin: 4.6 g/dL (ref 3.5–5.5)
Bilirubin Total: 0.6 mg/dL (ref 0.0–1.2)
Bilirubin, Direct: 0.16 mg/dL (ref 0.00–0.40)
Total Protein: 7.3 g/dL (ref 6.0–8.5)

## 2018-02-12 ENCOUNTER — Other Ambulatory Visit: Payer: 59

## 2018-05-05 ENCOUNTER — Other Ambulatory Visit: Payer: Self-pay | Admitting: Interventional Cardiology

## 2018-05-24 ENCOUNTER — Other Ambulatory Visit: Payer: Self-pay | Admitting: Interventional Cardiology

## 2018-05-24 MED ORDER — CLOPIDOGREL BISULFATE 75 MG PO TABS: 75.0000 mg | ORAL_TABLET | Freq: Every day | ORAL | 0 refills | Status: DC

## 2018-05-25 NOTE — Telephone Encounter (Signed)
Outpatient Medication Detail    Disp Refills Start End   clopidogrel (PLAVIX) 75 MG tablet 15 tablet 0 05/24/2018    Sig - Route: Take 1 tablet (75 mg total) by mouth daily with breakfast. Please make overdue appt with Dr. Irish Lack before anymore refills. 2nd attempt - Oral   Sent to pharmacy as: clopidogrel (PLAVIX) 75 MG tablet   Notes to Pharmacy: Please call our office to schedule an overdue yearly appointment with Dr. Irish Lack before anymore refills. [phone]. Thank you 2nd attempt   E-Prescribing Status: Receipt confirmed by pharmacy (05/24/2018 9:31 AM EST)   Pharmacy   [facility] - [place], [place]

## 2018-09-18 ENCOUNTER — Other Ambulatory Visit: Payer: Self-pay | Admitting: Interventional Cardiology

## 2018-11-19 ENCOUNTER — Telehealth: Payer: Self-pay

## 2018-11-19 NOTE — Telephone Encounter (Signed)

## 2018-11-20 ENCOUNTER — Other Ambulatory Visit: Payer: Self-pay | Admitting: Interventional Cardiology

## 2018-11-22 ENCOUNTER — Other Ambulatory Visit: Payer: Self-pay

## 2018-11-22 ENCOUNTER — Ambulatory Visit: Payer: 59 | Admitting: Cardiology

## 2018-11-22 ENCOUNTER — Encounter: Payer: Self-pay | Admitting: Cardiology

## 2018-11-22 VITALS — BP 110/74 | HR 76 | Ht 72.0 in | Wt 187.8 lb

## 2018-11-22 DIAGNOSIS — I5042 Chronic combined systolic (congestive) and diastolic (congestive) heart failure: Secondary | ICD-10-CM

## 2018-11-22 DIAGNOSIS — I77810 Thoracic aortic ectasia: Secondary | ICD-10-CM

## 2018-11-22 DIAGNOSIS — I2510 Atherosclerotic heart disease of native coronary artery without angina pectoris: Secondary | ICD-10-CM | POA: Diagnosis not present

## 2018-11-22 DIAGNOSIS — E785 Hyperlipidemia, unspecified: Secondary | ICD-10-CM

## 2018-11-22 MED ORDER — CLOPIDOGREL BISULFATE 75 MG PO TABS: 75.0000 mg | ORAL_TABLET | Freq: Every day | ORAL | 3 refills | Status: DC

## 2018-11-22 MED ORDER — ATORVASTATIN CALCIUM 40 MG PO TABS: 40.0000 mg | ORAL_TABLET | Freq: Every day | ORAL | 3 refills | Status: DC

## 2018-11-22 NOTE — Telephone Encounter (Signed)
Outpatient Medication Detail   Disp Refills Start End   clopidogrel (PLAVIX) 75 MG tablet 90 tablet 3 11/22/2018    Sig - Route: Take 1 tablet (75 mg total) by mouth daily with breakfast. Please make overdue appt with Dr. Irish Lack before anymore refills. 2nd attempt - Oral   Sent to pharmacy as: clopidogrel (PLAVIX) 75 MG tablet   Notes to Pharmacy: Please call our office to schedule an overdue yearly appointment with Dr. Irish Lack before anymore refills. [phone]. Thank you 2nd attempt   E-Prescribing Status: Receipt confirmed by pharmacy (11/22/2018 9:06 AM EDT)   Pharmacy  [place], [place]

## 2018-11-22 NOTE — Patient Instructions (Signed)
Medication Instructions:  none If you need a refill on your cardiac medications before your next appointment, please call your pharmacy.   Lab work:SCHEDULE WITH ECHO FLP HFT If you have labs (blood work) drawn today and your tests are completely normal, you will receive your results only by: Marland Kitchen MyChart Message (if you have MyChart) OR . A paper copy in the mail If you have any lab test that is abnormal or we need to change your treatment, we will call you to review the results.  Testing/Procedures: Your physician has requested that you have an echocardiogram. Echocardiography is a painless test that uses sound waves to create images of your heart. It provides your doctor with information about the size and shape of your heart and how well your heart's chambers and valves are working. This procedure takes approximately one hour. There are no restrictions for this procedure.    Follow-Up: At [facility], you and your health needs are our priority.  As part of our continuing mission to provide you with exceptional heart care, we have created designated Provider Care Teams.  These Care Teams include your primary Cardiologist (physician) and Advanced Practice Providers (APPs -  Physician Assistants and Nurse Practitioners) who all work together to provide you with the care you need, when you need it. You will need a follow up appointment in 1 years.  Please call our office 2 months in advance to schedule this appointment.  You may see Dr Irish Lack or one of the following Advanced Practice Providers on your designated Care Team:   Lyda Jester, PA-C Melina Copa, PA-C . Ermalinda Barrios, PA-C  Any Other Special Instructions Will Be Listed Below (If Applicable).

## 2018-11-22 NOTE — Progress Notes (Signed)
11/22/2018 Dale Meadows   1960/06/23  [id number]  Primary Physician [employer], [employer] [facility] [place] Primary Cardiologist: Dr. Irish Lack Electrophysiologist: None   Reason for Visit/CC: Annual f/u for CAD, Chronic Systolic and Diastolic Dysfunction, HLD and Dilated Aortic Root,  HPI:  Dale Meadows is a 58 y.o. male who is being seen today for his annual cardiology f/u for CAD. 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 Unable to be opened successfully. At the time of cathin 2015, he had a patent left coronary artery system. There were left to right collaterals feeding the distal RCA territory. Echo in 2015 showed mild-moderately reduced LVEF at 40-45%. No significant abnormalities. G1DD was noted. The aortic root was also mildly dilated at 39 mm. Per notes, medical therapy for systolic HF was limited. He did not tolerate ACE/ARB or  blocker due to hypotension. Additional medical problems include HLD and ulcerative colitis. His last colonoscopy in 2019 showed that his UC was stable.   Today in clinic, he reports that he has done well. No anginal symptoms. Denies CP and dyspnea. No exertional symptoms w/ exercise and yard work. Compliant w/ meds. Denies abnormal bleeding w/ Plavix. No side effects w/ statin. Does not smoke. Denies LEE, orthopnea, PND, palpitations, syncope/ near syncope. BP is well controlled w/o medications.    Cardiac Studies   Cardiac Cath 07/29/2013  ANGIOGRAPHIC DATA:   The left main coronary artery is normal.  The left anterior descending artery is proximal one third of the LAD is ectatic. The LAD and diagonals are widely patent..  The left circumflex artery is widely patent. A large first obtuse marginal and small second and third obtuse marginals are normal..  The right coronary artery is ectatic and totally occluded distally with no runoff into the PDA or left ventricular branches. The distal right coronary including  a large PDA and several left ventricular branches fills by left to right collaterals. Collateral filling is much improved on today's study compared with 48 hours ago.Marland Kitchen   LEFT VENTRICULOGRAM:  Left ventricular angiogram was done in the 30 RAO projection and revealed inferior wall akinesis and EF of 40%   IMPRESSIONS:  1. Total occlusion of the distal right coronary. The inferior wall branches fill by left to right collaterals. 2. Widely patent left coronary system 3. Left ventricular dysfunction with estimated ejection fraction of 40% and inferior wall akinesis 4. Hypotension  2D Echo 02/28/14 Study Conclusions   - Left ventricle: The cavity size was normal. Wall thickness was  normal. Systolic function was mildly to moderately reduced. The  estimated ejection fraction was in the range of 40% to 45%.  Inferior and inferolateral hypokinesis. Doppler parameters are  consistent with abnormal left ventricular relaxation (grade 1  diastolic dysfunction).  - Aortic valve: There was no stenosis.  - Aorta: Mildly dilated aortic root. Aortic root dimension: 39 mm  (ED).  - Mitral valve: There was trivial regurgitation.  - Right ventricle: The cavity size was normal. Systolic function  was normal.  - Tricuspid valve: Peak RV-RA gradient (S): 23 mm Hg.  - Pulmonary arteries: PA peak pressure: 26 mm Hg (S).  - Inferior vena cava: The vessel was normal in size. The  respirophasic diameter changes were in the normal range (>= 50%),  consistent with normal central venous pressure.   Impressions:   - Normal LV size with EF 40-45%. Inferior and inferolateral  hypokinesis. Normal RV size and systolic function. No significant  valvular abnormalities.   Current Meds  Medication Sig  . atorvastatin (LIPITOR) 40 MG tablet TAKE 1 TABLET BY MOUTH  DAILY  . clopidogrel (PLAVIX) 75 MG tablet Take 1 tablet (75 mg total) by mouth daily with breakfast. Please make overdue appt  with Dr. Irish Lack before anymore refills. 2nd attempt  . LIALDA 1.2 G EC tablet Take 1.2 g by mouth daily with breakfast.   . nitroGLYCERIN (NITROSTAT) 0.4 MG SL tablet DISSOLVE 1 TABLET UNDER THE TONGUE EVERY 5 MINUTES AS  NEEDED FOR CHEST PAIN,NOT  TO EXCEED 3 DOSES IN 15 MIN IF PAIN PERSISTS, CALL 911   No Known Allergies Past Medical History:  Diagnosis Date  . CAD (coronary artery disease)    a. s/p inferior STEMI (07/2013)  . Cancer ([place]) 05/29/2013   prostate  . Hematuria   . HLD (hyperlipidemia)   . Junctional bradycardia   . Seizures ([place])    last seizure 26 years ago  . Ulcerative colitis ([place])    Family History  Problem Relation Age of Onset  . Hepatitis C Mother   . Diabetes type II Mother   . Hypertension Mother   . Hypertension Father   . Hypertension Brother   . Deep vein thrombosis Brother   . Heart disease Neg Hx   . Heart attack Neg Hx   . Stroke Neg Hx    Past Surgical History:  Procedure Laterality Date  . BIOPSY PROSTATE    . LEFT HEART CATHETERIZATION WITH CORONARY ANGIOGRAM N/A 07/27/2013   Procedure: LEFT HEART CATHETERIZATION WITH CORONARY ANGIOGRAM;  Surgeon: Jettie Booze, MD;  Location: [facility] CATH LAB;  Service: Cardiovascular;  Laterality: N/A;  . LEFT HEART CATHETERIZATION WITH CORONARY ANGIOGRAM N/A 07/29/2013   Procedure: LEFT HEART CATHETERIZATION WITH CORONARY ANGIOGRAM;  Surgeon: Sinclair Grooms, MD;  Location: [facility] CATH LAB;  Service: Cardiovascular;  Laterality: N/A;   Social History   Socioeconomic History  . Marital status: Married    Spouse name: Not on file  . Number of children: Not on file  . Years of education: Not on file  . Highest education level: Not on file  Occupational History  . Not on file  Social Needs  . Financial resource strain: Not on file  . Food insecurity    Worry: Not on file    Inability: Not on file  . Transportation needs    Medical: Not on file    Non-medical: Not on file  Tobacco Use  . Smoking  status: Former Smoker    Types: Cigars    Quit date: 07/27/2008    Years since quitting: 10.3  . Smokeless tobacco: Never Used  Substance and Sexual Activity  . Alcohol use: Yes    Comment: occassional beer or wine, < 1 per week  . Drug use: No  . Sexual activity: Yes  Lifestyle  . Physical activity    Days per week: Not on file    Minutes per session: Not on file  . Stress: Not on file  Relationships  . Social Herbalist on phone: Not on file    Gets together: Not on file    Attends religious service: Not on file    Active member of club or organization: Not on file    Attends meetings of clubs or organizations: Not on file    Relationship status: Not on file  . Intimate partner violence    Fear of current or ex partner: Not on  file    Emotionally abused: Not on file    Physically abused: Not on file    Forced sexual activity: Not on file  Other Topics Concern  . Not on file  Social History Narrative  . Not on file     Lipid Panel     Component Value Date/Time   CHOL 174 02/11/2018 0838   TRIG 122 02/11/2018 0838   HDL 45 02/11/2018 0838   CHOLHDL 3.9 02/11/2018 0838   CHOLHDL 4 01/03/2015 0817   VLDL 16.8 01/03/2015 0817   LDLCALC 105 (H) 02/11/2018 0838    Review of Systems: General: negative for chills, fever, night sweats or weight changes.  Cardiovascular: negative for chest pain, dyspnea on exertion, edema, orthopnea, palpitations, paroxysmal nocturnal dyspnea or shortness of breath Dermatological: negative for rash Respiratory: negative for cough or wheezing Urologic: negative for hematuria Abdominal: negative for nausea, vomiting, diarrhea, bright red blood per rectum, melena, or hematemesis Neurologic: negative for visual changes, syncope, or dizziness All other systems reviewed and are otherwise negative except as noted above.   Physical Exam:  Blood pressure 110/74, pulse 76, height 6' (1.829 m), weight 187 lb 12.8 oz (85.2 kg), SpO2 95 %.   General appearance: alert, cooperative and no distress Neck: no carotid bruit and no JVD Lungs: clear to auscultation bilaterally Heart: regular rate and rhythm, S1, S2 normal, no murmur, click, rub or gallop Extremities: extremities normal, atraumatic, no cyanosis or edema Pulses: 2+ and symmetric Skin: Skin color, texture, turgor normal. No rashes or lesions Neurologic: Grossly normal  EKG NSR, old inferior infarct, 76 bpm -- personally reviewed   ASSESSMENT AND PLAN:   1. 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 Unable to be opened successfully. At the time of cathin 2015, he had a patent left coronary artery system. There were left to right collaterals feeding the distal RCA territory. Remains on Plavix and statin. No  blocker due to soft BP. He is stable w/o anginal symptoms. Continue medical therapy.   2. HLD: on statin therapy w/ Lipitor 40 mg. Per previous documentation, Dr. Irish Lack has recommended an LDL goal < 100 (see lipid panel result note 10/2017). Last lipid panel 01/2018 showed LDL at 105. We will repeat FLP and HFTs.   3. Combined Systolic and Diastolic HF: Last echo was in 2015. EF was 40-45% with G1DD. Euvolemic on exam. No symptoms. Functional status is good. NYHA Functional Class II.   4. Dilated Aortic Root: mildly dilated by echo in 2015, measuring at 39 mm. Will update study to reevaluate.    Follow-Up w/ Dr. Irish Lack in 1 year.   Dale Meadows, MHS [facility] 11/22/2018 8:48 AM

## 2018-12-13 ENCOUNTER — Ambulatory Visit (HOSPITAL_COMMUNITY): Payer: 59 | Attending: Cardiology

## 2018-12-13 ENCOUNTER — Other Ambulatory Visit: Payer: 59 | Admitting: *Deleted

## 2018-12-13 ENCOUNTER — Other Ambulatory Visit: Payer: Self-pay

## 2018-12-13 ENCOUNTER — Telehealth: Payer: Self-pay

## 2018-12-13 DIAGNOSIS — I77810 Thoracic aortic ectasia: Secondary | ICD-10-CM | POA: Diagnosis present

## 2018-12-13 DIAGNOSIS — I5042 Chronic combined systolic (congestive) and diastolic (congestive) heart failure: Secondary | ICD-10-CM

## 2018-12-13 DIAGNOSIS — E785 Hyperlipidemia, unspecified: Secondary | ICD-10-CM | POA: Insufficient documentation

## 2018-12-13 LAB — LIPID PANEL
Chol/HDL Ratio: 3.9 ratio (ref 0.0–5.0)
Cholesterol, Total: 172 mg/dL (ref 100–199)
HDL: 44 mg/dL (ref >39)
LDL Calculated: 103 mg/dL — ABNORMAL HIGH (ref 0–99)
Triglycerides: 124 mg/dL (ref 0–149)
VLDL Cholesterol Cal: 25 mg/dL (ref 5–40)

## 2018-12-13 LAB — HEPATIC FUNCTION PANEL
ALT: 31 IU/L (ref 0–44)
AST: 24 IU/L (ref 0–40)
Albumin: 4.5 g/dL (ref 3.8–4.9)
Alkaline Phosphatase: 91 IU/L (ref 39–117)
Bilirubin Total: 0.5 mg/dL (ref 0.0–1.2)
Bilirubin, Direct: 0.15 mg/dL (ref 0.00–0.40)
Total Protein: 7.3 g/dL (ref 6.0–8.5)

## 2018-12-13 NOTE — Telephone Encounter (Signed)
-----   Message from Consuelo Pandy, [place] sent at 12/13/2018  2:17 PM EDT ----- Ultrasound unchanged. EF (pump function) of heart is still mildly reduced but same as previous study. The aortic root is also stable. Same size as prior study. No progression. This is good. F/u with Dr. Irish Lack in 1 year.

## 2018-12-13 NOTE — Telephone Encounter (Signed)
Notes recorded by Frederik Schmidt, RN on 12/13/2018 at 2:34 PM EDT  The patient has been notified of the result and verbalized understanding. All questions (if any) were answered.  Frederik Schmidt, RN 12/13/2018 2:34 PM

## 2018-12-16 ENCOUNTER — Telehealth: Payer: Self-pay

## 2018-12-16 NOTE — Telephone Encounter (Signed)
-----   Message from Consuelo Pandy, [place] sent at 12/15/2018  5:48 PM EDT ----- Cholesterol is not at goal. LDL should be < 70. Elevated at 103. Check compliance w/ Lipitor 40 mg. If he has been compliant, then increase dose to 80 mg nightly and repeat FLP and HFts in 8 weeks

## 2018-12-16 NOTE — Telephone Encounter (Signed)
Patient returning call.

## 2018-12-16 NOTE — Telephone Encounter (Signed)
Notes recorded by Frederik Schmidt, RN on 12/16/2018 at 8:35 AM EDT  [id number] 8/20  ------

## 2018-12-17 NOTE — Telephone Encounter (Signed)
Called and made patient aware.

## 2018-12-17 NOTE — Telephone Encounter (Signed)
Since his MI was different, in the setting of ectatic RCA, rather than true athersclerotifc disease, ok with LDL around 100 on high potency statin.  Ok to continue 40 mg daily.

## 2018-12-17 NOTE — Telephone Encounter (Signed)
I spoke to the patient who said that at one point Dr Irish Lack set a LDL goal of less than 100 and was pleased with anything close to that. He resulted at 103 and has been compliant with 40 mg; thus the patient is reluctant to increase his Lipitor to 80 mg at this time.  He would like further advisement from Dr Irish Lack.

## 2019-05-06 ENCOUNTER — Other Ambulatory Visit: Payer: Self-pay

## 2019-05-09 ENCOUNTER — Other Ambulatory Visit: Payer: Self-pay | Admitting: Interventional Cardiology

## 2019-05-09 MED ORDER — CLOPIDOGREL BISULFATE 75 MG PO TABS: 75.0000 mg | ORAL_TABLET | Freq: Every day | ORAL | 1 refills | Status: DC

## 2019-07-14 DIAGNOSIS — Z79899 Other long term (current) drug therapy: Secondary | ICD-10-CM | POA: Diagnosis not present

## 2019-07-14 DIAGNOSIS — Z8679 Personal history of other diseases of the circulatory system: Secondary | ICD-10-CM | POA: Diagnosis not present

## 2019-07-14 DIAGNOSIS — K5150 Left sided colitis without complications: Secondary | ICD-10-CM | POA: Diagnosis not present

## 2019-07-21 ENCOUNTER — Other Ambulatory Visit: Payer: Self-pay

## 2019-07-21 MED ORDER — NITROGLYCERIN 0.4 MG SL SUBL: SUBLINGUAL_TABLET | SUBLINGUAL | 0 refills | Status: AC

## 2020-02-14 ENCOUNTER — Telehealth: Payer: Self-pay | Admitting: Interventional Cardiology

## 2020-02-14 MED ORDER — CLOPIDOGREL BISULFATE 75 MG PO TABS: 75.0000 mg | ORAL_TABLET | Freq: Every day | ORAL | 0 refills | Status: DC

## 2020-02-14 NOTE — Progress Notes (Signed)
Cardiology Office Note   Date:  02/15/2020   ID:  Dale Meadows, DOB 05-Oct-1960, MRN [id number]  PCP:  [facility] [place]    No chief complaint on file.  Old MI  Wt Readings from Last 3 Encounters:  02/15/20 196 lb 3.2 oz (89 kg)  11/22/18 187 lb 12.8 oz (85.2 kg)  05/04/17 199 lb 3.2 oz (90.4 kg)       History of Present Illness: Dale Meadows is a 59 y.o. 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 Unable to be opened successfully.   At the time of cathin 2015, he had a patent left coronary artery system. There are left to right collaterals feeding the distal RCA territory.  Hemoved back to [place], [employer]. He had been laid off in [place]. He retired, but worked part time for a not for [employer]. In 2018, he fully retired.    Since the last visit, he has done well.    Denies : Chest pain. Dizziness. Leg edema. Nitroglycerin use. Orthopnea. Palpitations. Paroxysmal nocturnal dyspnea. Syncope.   Has some DOE with yard work, more when he is lifting something heavy like mulch.      Past Medical History:  Diagnosis Date  . CAD (coronary artery disease)    a. s/p inferior STEMI (07/2013)  . Cancer ([place]) 05/29/2013   prostate  . Hematuria   . HLD (hyperlipidemia)   . Junctional bradycardia   . Seizures ([place])    last seizure 26 years ago  . Ulcerative colitis [facility])     Past Surgical History:  Procedure Laterality Date  . BIOPSY PROSTATE    . LEFT HEART CATHETERIZATION WITH CORONARY ANGIOGRAM N/A 07/27/2013   Procedure: LEFT HEART CATHETERIZATION WITH CORONARY ANGIOGRAM;  Surgeon: Jettie Booze, MD;  Location: [facility] LAB;  Service: Cardiovascular;  Laterality: N/A;  . LEFT HEART CATHETERIZATION WITH CORONARY ANGIOGRAM N/A 07/29/2013   Procedure: LEFT HEART CATHETERIZATION WITH CORONARY ANGIOGRAM;  Surgeon: Sinclair Grooms, MD;  Location: [facility] CATH LAB;  Service: Cardiovascular;   Laterality: N/A;     Current Outpatient Medications  Medication Sig Dispense Refill  . atorvastatin (LIPITOR) 40 MG tablet Take 1 tablet (40 mg total) by mouth daily. 90 tablet 3  . clopidogrel (PLAVIX) 75 MG tablet Take 1 tablet (75 mg total) by mouth daily with breakfast. Please keep upcoming appt with Dr. Irish Lack in October before anymore refills. Thank you 90 tablet 0  . LIALDA 1.2 G EC tablet Take 1.2 g by mouth daily with breakfast.     . nitroGLYCERIN (NITROSTAT) 0.4 MG SL tablet DISSOLVE 1 TABLET UNDER THE TONGUE EVERY 5 MINUTES AS  NEEDED FOR CHEST PAIN,NOT  TO EXCEED 3 DOSES IN 15 MIN IF PAIN PERSISTS, CALL 911 100 tablet 0   No current facility-administered medications for this visit.    Allergies:   Patient has no known allergies.    Social History:  The patient  reports that he quit smoking about 11 years ago. His smoking use included cigars. He has never used smokeless tobacco. He reports current alcohol use. He reports that he does not use drugs.   Family History:  The patient's family history includes Deep vein thrombosis in his brother; Diabetes type II in his mother; Hepatitis C in his mother; Hypertension in his brother, father, and mother.    ROS:  Please see the history of present illness.   Otherwise, review of systems are  positive for less exercise than prescribed; some dietary indiscretion.   All other systems are reviewed and negative.    PHYSICAL EXAM: VS:  BP 100/60   Pulse 70   Ht 6' (1.829 m)   Wt 196 lb 3.2 oz (89 kg)   SpO2 95%   BMI 26.61 kg/m  , BMI Body mass index is 26.61 kg/m. GEN: Well nourished, well developed, in no acute distress  HEENT: normal  Neck: no JVD, carotid bruits, or masses Cardiac: RRR; no murmurs, rubs, or gallops,no edema  Respiratory:  clear to auscultation bilaterally, normal work of breathing GI: soft, nontender, nondistended, + BS MS: no deformity or atrophy  Skin: warm and dry, no rash Neuro:  Strength and sensation  are intact Psych: euthymic mood, full affect   EKG:   The ekg ordered today demonstrates NSR, inferior Q waves   Recent Labs: No results found for requested labs within last 8760 hours.   Lipid Panel    Component Value Date/Time   CHOL 172 12/13/2018 1017   TRIG 124 12/13/2018 1017   HDL 44 12/13/2018 1017   CHOLHDL 3.9 12/13/2018 1017   CHOLHDL 4 01/03/2015 0817   VLDL 16.8 01/03/2015 0817   LDLCALC 103 (H) 12/13/2018 1017     Other studies Reviewed: Additional studies/ records that were reviewed today with results demonstrating: 2020 echo reviewed.   ASSESSMENT AND PLAN:  1. CAD/Old MI: No angina.  COntinue clopidogrel given aneurysmal areas of coronary vessels and CTO of RCA.  No CHF. 2. Hyperlipidemia: The current medical regimen is effective;  continue present plan and medications.  LDL 103 in 2020.  3. Ulcerative colitis: Dietary restrictions for UC, limit fiber intake. 4. Dilated root: 39 mm in 2015 and 2020.     Current medicines are reviewed at length with the patient today.  The patient concerns regarding his medicines were addressed.  The following changes have been made:  No change  Labs/ tests ordered today include:  No orders of the defined types were placed in this encounter.   Recommend 150 minutes/week of aerobic exercise Low fat, low carb, high fiber diet recommended  Disposition:   FU in 1 year   Signed, Larae Grooms, MD  02/15/2020 3:09 PM    [facility] [address], [place], [place]  [address] Phone: [phone]; Fax: [phone]

## 2020-02-14 NOTE — Telephone Encounter (Signed)
Pt's medication was sent to pt's pharmacy as requested. Confirmation received.  °

## 2020-02-14 NOTE — Telephone Encounter (Signed)
*  STAT* If patient is at the pharmacy, call can be transferred to refill team.   1. Which medications need to be refilled? (please list name of each medication and dose if known)  clopidogrel (PLAVIX) 75 MG tablet  2. Which pharmacy/location (including street and city if local pharmacy) is medication to be sent to? [facility] [address] [place]  3. Do they need a 30 day or 90 day supply? 90 day supply   Pt has an appt scheduled for tomorrow 02/15/20.

## 2020-02-15 ENCOUNTER — Ambulatory Visit (INDEPENDENT_AMBULATORY_CARE_PROVIDER_SITE_OTHER): Payer: BC Managed Care – PPO | Admitting: Interventional Cardiology

## 2020-02-15 ENCOUNTER — Other Ambulatory Visit: Payer: Self-pay

## 2020-02-15 ENCOUNTER — Encounter: Payer: Self-pay | Admitting: Interventional Cardiology

## 2020-02-15 VITALS — BP 100/60 | HR 70 | Ht 72.0 in | Wt 196.2 lb

## 2020-02-15 DIAGNOSIS — I2510 Atherosclerotic heart disease of native coronary artery without angina pectoris: Secondary | ICD-10-CM

## 2020-02-15 DIAGNOSIS — E785 Hyperlipidemia, unspecified: Secondary | ICD-10-CM

## 2020-02-15 DIAGNOSIS — I77810 Thoracic aortic ectasia: Secondary | ICD-10-CM | POA: Diagnosis not present

## 2020-02-15 DIAGNOSIS — I5042 Chronic combined systolic (congestive) and diastolic (congestive) heart failure: Secondary | ICD-10-CM

## 2020-02-15 NOTE — Patient Instructions (Signed)
Medication Instructions:  Your physician recommends that you continue on your current medications as directed. Please refer to the Current Medication list given to you today.  *If you need a refill on your cardiac medications before your next appointment, please call your pharmacy*   Lab Work: Your physician recommends that you return for a FASTING lipid profile  If you have labs (blood work) drawn today and your tests are completely normal, you will receive your results only by: Marland Kitchen MyChart Message (if you have MyChart) OR . A paper copy in the mail If you have any lab test that is abnormal or we need to change your treatment, we will call you to review the results.   Testing/Procedures: None   Follow-Up: At [facility], you and your health needs are our priority.  As part of our continuing mission to provide you with exceptional heart care, we have created designated Provider Care Teams.  These Care Teams include your primary Cardiologist (physician) and Advanced Practice Providers (APPs -  Physician Assistants and Nurse Practitioners) who all work together to provide you with the care you need, when you need it.  We recommend signing up for the patient portal called "MyChart".  Sign up information is provided on this After Visit Summary.  MyChart is used to connect with patients for Virtual Visits (Telemedicine).  Patients are able to view lab/test results, encounter notes, upcoming appointments, etc.  Non-urgent messages can be sent to your provider as well.   To learn more about what you can do with MyChart, go to [email or web address].    Your next appointment:   12 month(s)  The format for your next appointment:   In Person  Provider:   You may see Casandra Doffing, MD or one of the following Advanced Practice Providers on your designated Care Team:    Melina Copa, PA-C  Ermalinda Barrios, PA-C    Other Instructions None

## 2020-02-22 ENCOUNTER — Other Ambulatory Visit: Payer: Self-pay

## 2020-02-22 ENCOUNTER — Other Ambulatory Visit: Payer: BC Managed Care – PPO | Admitting: *Deleted

## 2020-02-22 DIAGNOSIS — I5042 Chronic combined systolic (congestive) and diastolic (congestive) heart failure: Secondary | ICD-10-CM | POA: Diagnosis not present

## 2020-02-22 DIAGNOSIS — I2510 Atherosclerotic heart disease of native coronary artery without angina pectoris: Secondary | ICD-10-CM | POA: Diagnosis not present

## 2020-02-22 DIAGNOSIS — E785 Hyperlipidemia, unspecified: Secondary | ICD-10-CM | POA: Diagnosis not present

## 2020-02-22 DIAGNOSIS — I77810 Thoracic aortic ectasia: Secondary | ICD-10-CM

## 2020-02-22 LAB — LIPID PANEL
Chol/HDL Ratio: 4.1 ratio (ref 0.0–5.0)
Cholesterol, Total: 183 mg/dL (ref 100–199)
HDL: 45 mg/dL (ref >39)
LDL Chol Calc (NIH): 115 mg/dL — ABNORMAL HIGH (ref 0–99)
Triglycerides: 131 mg/dL (ref 0–149)
VLDL Cholesterol Cal: 23 mg/dL (ref 5–40)

## 2020-04-04 DIAGNOSIS — N401 Enlarged prostate with lower urinary tract symptoms: Secondary | ICD-10-CM | POA: Diagnosis not present

## 2020-04-04 DIAGNOSIS — R350 Frequency of micturition: Secondary | ICD-10-CM | POA: Diagnosis not present

## 2020-04-10 ENCOUNTER — Other Ambulatory Visit (HOSPITAL_BASED_OUTPATIENT_CLINIC_OR_DEPARTMENT_OTHER): Payer: Self-pay | Admitting: Internal Medicine

## 2020-04-10 ENCOUNTER — Ambulatory Visit: Payer: BC Managed Care – PPO | Attending: Internal Medicine

## 2020-04-10 DIAGNOSIS — Z23 Encounter for immunization: Secondary | ICD-10-CM

## 2020-04-10 NOTE — Progress Notes (Signed)
   Covid-19 Vaccination Clinic  Name:  Dale Meadows    MRN: [id number] DOB: 11-30-1960  04/10/2020  [name] was observed post Covid-19 immunization for 15 minutes without incident. He was provided with Vaccine Information Sheet and instruction to access the V-Safe system.   [name] was instructed to call 911 with any severe reactions post vaccine: Marland Kitchen Difficulty breathing  . Swelling of face and throat  . A fast heartbeat  . A bad rash all over body  . Dizziness and weakness   Immunizations Administered    Name Date Dose VIS Date Route   Pfizer COVID-19 Vaccine 04/10/2020  1:01 PM 0.3 mL 02/15/2020 Intramuscular   Manufacturer: [place]   Lot: [id number]   [place]: [id number]

## 2020-04-11 DIAGNOSIS — N401 Enlarged prostate with lower urinary tract symptoms: Secondary | ICD-10-CM | POA: Diagnosis not present

## 2020-04-11 DIAGNOSIS — R350 Frequency of micturition: Secondary | ICD-10-CM | POA: Diagnosis not present

## 2020-04-11 DIAGNOSIS — R972 Elevated prostate specific antigen [PSA]: Secondary | ICD-10-CM | POA: Diagnosis not present

## 2020-04-16 MED FILL — PFIZER-BIONTECH COVID-19 VA: 30 | 1 days supply | Qty: 0 | Fill #0

## 2020-05-29 ENCOUNTER — Other Ambulatory Visit: Payer: Self-pay | Admitting: Interventional Cardiology

## 2020-06-19 ENCOUNTER — Other Ambulatory Visit: Payer: Self-pay

## 2020-06-19 MED ORDER — ATORVASTATIN CALCIUM 40 MG PO TABS: 40.0000 mg | ORAL_TABLET | Freq: Every day | ORAL | 3 refills | Status: DC

## 2020-08-28 ENCOUNTER — Other Ambulatory Visit: Payer: Self-pay | Admitting: Interventional Cardiology

## 2020-10-24 ENCOUNTER — Other Ambulatory Visit: Payer: Self-pay

## 2020-10-24 ENCOUNTER — Encounter: Payer: Self-pay | Admitting: Family Medicine

## 2020-10-24 ENCOUNTER — Ambulatory Visit (INDEPENDENT_AMBULATORY_CARE_PROVIDER_SITE_OTHER): Payer: BC Managed Care – PPO | Admitting: Family Medicine

## 2020-10-24 VITALS — BP 112/72 | HR 80 | Temp 98.1°F | Resp 15 | Ht 72.0 in | Wt 195.0 lb

## 2020-10-24 DIAGNOSIS — Z23 Encounter for immunization: Secondary | ICD-10-CM | POA: Diagnosis not present

## 2020-10-24 DIAGNOSIS — K5190 Ulcerative colitis, unspecified, without complications: Secondary | ICD-10-CM

## 2020-10-24 DIAGNOSIS — C61 Malignant neoplasm of prostate: Secondary | ICD-10-CM | POA: Diagnosis not present

## 2020-10-24 DIAGNOSIS — I2510 Atherosclerotic heart disease of native coronary artery without angina pectoris: Secondary | ICD-10-CM | POA: Diagnosis not present

## 2020-10-24 DIAGNOSIS — I252 Old myocardial infarction: Secondary | ICD-10-CM

## 2020-10-24 DIAGNOSIS — E782 Mixed hyperlipidemia: Secondary | ICD-10-CM
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
## 2020-10-24 NOTE — Patient Instructions (Signed)
Nice meeting you today.  Happy to be your primary care provider.  Keep follow-up with your specialist as we discussed.  Physical in the next 3 months with some fasting lab work at that time, but let me know if there are questions sooner.  Take care.

## 2020-10-25 ENCOUNTER — Ambulatory Visit: Payer: BC Managed Care – PPO | Admitting: Family Medicine

## 2020-11-09 DIAGNOSIS — R6889 Other general symptoms and signs: Secondary | ICD-10-CM | POA: Diagnosis not present

## 2020-12-24 DIAGNOSIS — E559 Vitamin D deficiency, unspecified: Secondary | ICD-10-CM | POA: Diagnosis not present

## 2020-12-24 DIAGNOSIS — K5150 Left sided colitis without complications: Secondary | ICD-10-CM | POA: Diagnosis not present

## 2021-01-17 ENCOUNTER — Telehealth: Payer: Self-pay

## 2021-01-17 NOTE — Telephone Encounter (Signed)
Patient's CPE originally scheduled for 9/23. Patient is sick and running a fever - possible COVID. Wanting to know if he can be worked in over the next month or so OR does he have to wait til next available CPE slot? Please advise

## 2021-01-17 NOTE — Telephone Encounter (Signed)
Ok to create 40 minute slot if needed.

## 2021-01-18 ENCOUNTER — Encounter: Payer: BC Managed Care – PPO | Admitting: Family Medicine

## 2021-02-18 ENCOUNTER — Other Ambulatory Visit: Payer: Self-pay | Admitting: Interventional Cardiology

## 2021-02-25 ENCOUNTER — Ambulatory Visit (INDEPENDENT_AMBULATORY_CARE_PROVIDER_SITE_OTHER): Payer: BC Managed Care – PPO | Admitting: Family Medicine

## 2021-02-25 ENCOUNTER — Other Ambulatory Visit: Payer: Self-pay

## 2021-02-25 ENCOUNTER — Encounter: Payer: Self-pay | Admitting: Family Medicine

## 2021-02-25 VITALS — BP 112/70 | HR 70 | Temp 98.1°F | Resp 16 | Ht 72.0 in | Wt 183.0 lb

## 2021-02-25 DIAGNOSIS — Z0000 Encounter for general adult medical examination without abnormal findings: Secondary | ICD-10-CM | POA: Diagnosis not present

## 2021-02-25 DIAGNOSIS — E782 Mixed hyperlipidemia: Secondary | ICD-10-CM | POA: Diagnosis not present

## 2021-02-25 DIAGNOSIS — C61 Malignant neoplasm of prostate: Secondary | ICD-10-CM

## 2021-02-25 DIAGNOSIS — L989 Disorder of the skin and subcutaneous tissue, unspecified: Secondary | ICD-10-CM

## 2021-02-25 DIAGNOSIS — Z131 Encounter for screening for diabetes mellitus: Secondary | ICD-10-CM

## 2021-02-25 DIAGNOSIS — Z130 Encounter for screening for diseases of the blood and blood-forming organs and certain disorders involving the immune mechanism: Secondary | ICD-10-CM

## 2021-02-25 NOTE — Patient Instructions (Addendum)
I recommend bivalent covid booster, and pneumonia vaccine if prior vaccine over 5 years ago.   Keep follow up with specialists as planned.  I will refer you to dermatology as we discussed.  Labs as planned with cardiology.  If any concerns or elevated blood sugar I can check some other labs.  Let me know if there are questions.  Follow-up in 1 year for physical as long as everything is stable.  Preventive Care 31-62 Years Old, Male Preventive care refers to lifestyle choices and visits with your health care provider that can promote health and wellness. This includes: A yearly physical exam. This is also called an annual wellness visit. Regular dental and eye exams. Immunizations. Screening for certain conditions. Healthy lifestyle choices, such as: Eating a healthy diet. Getting regular exercise. Not using drugs or products that contain nicotine and tobacco. Limiting alcohol use. What can I expect for my preventive care visit? Physical exam Your health care provider will check your: Height and weight. These may be used to calculate your BMI (body mass index). BMI is a measurement that tells if you are at a healthy weight. Heart rate and blood pressure. Body temperature. Skin for abnormal spots. Counseling Your health care provider may ask you questions about your: Past medical problems. Family's medical history. Alcohol, tobacco, and drug use. Emotional well-being. Home life and relationship well-being. Sexual activity. Diet, exercise, and sleep habits. Work and work Statistician. Access to firearms. What immunizations do I need? Vaccines are usually given at various ages, according to a schedule. Your health care provider will recommend vaccines for you based on your age, medical history, and lifestyle or other factors, such as travel or where you work. What tests do I need? Blood tests Lipid and cholesterol levels. These may be checked every 5 years, or more often if you are  over 65 years old. Hepatitis C test. Hepatitis B test. Screening Lung cancer screening. You may have this screening every year starting at age 16 if you have a 30-pack-year history of smoking and currently smoke or have quit within the past 15 years. Prostate cancer screening. Recommendations will vary depending on your family history and other risks. Genital exam to check for testicular cancer or hernias. Colorectal cancer screening. All adults should have this screening starting at age 72 and continuing until age 12. Your health care provider may recommend screening at age 65 if you are at increased risk. You will have tests every 1-10 years, depending on your results and the type of screening test. Diabetes screening. This is done by checking your blood sugar (glucose) after you have not eaten for a while (fasting). You may have this done every 1-3 years. STD (sexually transmitted disease) testing, if you are at risk. Follow these instructions at home: Eating and drinking  Eat a diet that includes fresh fruits and vegetables, whole grains, lean protein, and low-fat dairy products. Take vitamin and mineral supplements as recommended by your health care provider. Do not drink alcohol if your health care provider tells you not to drink. If you drink alcohol: Limit how much you have to 0-2 drinks a day. Be aware of how much alcohol is in your drink. In the [place] one drink equals one 12 oz bottle of beer (355 mL), one 5 oz glass of wine (148 mL), or one 1 oz glass of hard liquor (44 mL). Lifestyle Take daily care of your teeth and gums. Brush your teeth every morning and night with fluoride toothpaste.  Floss one time each day. Stay active. Exercise for at least 30 minutes 5 or more days each week. Do not use any products that contain nicotine or tobacco, such as cigarettes, e-cigarettes, and chewing tobacco. If you need help quitting, ask your health care provider. Do not use drugs. If  you are sexually active, practice safe sex. Use a condom or other form of protection to prevent STIs (sexually transmitted infections). If told by your health care provider, take low-dose aspirin daily starting at age 80. Find healthy ways to cope with stress, such as: Meditation, yoga, or listening to music. Journaling. Talking to a trusted person. Spending time with friends and family. Safety Always wear your seat belt while driving or riding in a vehicle. Do not drive: If you have been drinking alcohol. Do not ride with someone who has been drinking. When you are tired or distracted. While texting. Wear a helmet and other protective equipment during sports activities. If you have firearms in your house, make sure you follow all gun safety procedures. What's next? Go to your health care provider once a year for an annual wellness visit. Ask your health care provider how often you should have your eyes and teeth checked. Stay up to date on all vaccines. This information is not intended to replace advice given to you by your health care provider. Make sure you discuss any questions you have with your health care provider. Document Revised: 06/22/2020 Document Reviewed: 04/08/2018 Elsevier Patient Education  2022 [employer].

## 2021-02-25 NOTE — Progress Notes (Signed)
Subjective:  Patient ID: Dale Meadows, male    DOB: [birth year]  Age: 60 y.o. MRN: [id number]  CC:  Chief Complaint  Patient presents with   Annual Exam    HPI Dale Meadows presents for  Annual physical exam New patient to establish care in June.  Cardiac: History of CAD status post STEMI in 5956, combined systolic and diastolic heart failure, chronic with dilated aortic root.  Cardiologist Dr. Ed Blalock, treated with Plavix, Lipitor. Has appt with cardiology in few days - lipid panel planned at that time. Not fasting  No chest pains.   Prostate cancer: History of elevated PSA, followed by Dr. Gaynelle Arabian, then Dr. Alinda Money.  Area on MRI in 2015 with peripheral zone nodule suspicious for carcinoma,  however no cancer noted on biopsy. Still suspicious for prostate CA, yearly urology visits, PSA has been declining. Minimal nocturia.   Ulcerative colitis: Followed by GI, Dr. Dorise Hiss, treated with Lialda 2/day. Stable symptoms. Appt later this year.   No flowsheet data found. Depression screening, negative PHQ.  Cancer screening: Colonoscopy 2019, every 5 years., followed closely by gastroenterology with history of ulcerative colitis. Prostate, followed as above with urology. Skin:had lesion removed from shoulder about 20 yrs ago. Few dry spots on skin and inside of ears. Itching at times. Frequent sun exposure.  No FH of skin CA.    Immunization History  Administered Date(s) Administered   Influenza,inj,Quad PF,6+ Mos 02/08/2014   PFIZER(Purple Top)SARS-COV-2 Vaccination 07/15/2019, 08/05/2019, 04/10/2020   Tdap 10/24/2020   Zoster Recombinat (Shingrix) 05/22/2020, 07/19/2020  Tdap updated in June.  Flu vaccine 3 weeks ago at pharmacy.  COVID-19 vaccine,second booster recommended last visit - recommended Bivalent booster. Pneumonia vaccination - had vaccine 8 years ago. Unsure which one. He will check on date - booster after 5 yrs discussed.   No results  found. Ophthalmology:yearly, wears contacts.   Dental: Every 6 months.   Alcohol: Rare.   Tobacco None.   Exercise: Body mass index is 24.82 kg/m. Increased some since last visit - [place] few days per week. Pickleball at times.    History Patient Active Problem List   Diagnosis Date Noted   Old MI (myocardial infarction) 01/03/2015   CAD (coronary artery disease)    Chest pain 02/07/2014   Speech abnormality 12/29/2013   Acute myocardial infarction, initial episode of care ([place]) 08/10/2013   Prostate cancer ([place]) 08/01/2013   Seizures ([place])    Hematuria    Junctional bradycardia    STEMI (ST elevation myocardial infarction), inferior 07/27/2013   Ulcerative colitis ([place]) 07/27/2013   HLD (hyperlipidemia) 07/27/2013   Acute myocardial infarction of other inferior wall, initial episode of care 07/27/2013   Cancer ([place]) 05/29/2013   Past Medical History:  Diagnosis Date   Allergy    CAD (coronary artery disease)    a. s/p inferior STEMI (07/2013)   Cancer ([place]) 05/29/2013   prostate   Hematuria    HLD (hyperlipidemia)    Junctional bradycardia    Myocardial infarction ([place])    Seizures ([place])    last seizure 26 years ago   Ulcerative colitis ([place])    Ulcerative colitis ([place])    Past Surgical History:  Procedure Laterality Date   ANTERIOR CRUCIATE LIGAMENT REPAIR Right 1990   BIOPSY PROSTATE     LEFT HEART CATHETERIZATION WITH CORONARY ANGIOGRAM N/A 07/27/2013   Procedure: LEFT HEART CATHETERIZATION WITH CORONARY ANGIOGRAM;  Surgeon: Jettie Booze, MD;  Location: [facility] CATH LAB;  Service: Cardiovascular;  Laterality:  N/A;   LEFT HEART CATHETERIZATION WITH CORONARY ANGIOGRAM N/A 07/29/2013   Procedure: LEFT HEART CATHETERIZATION WITH CORONARY ANGIOGRAM;  Surgeon: Sinclair Grooms, MD;  Location: [facility] CATH LAB;  Service: Cardiovascular;  Laterality: N/A;   No Known Allergies Prior to Admission medications   Medication Sig Start Date End Date Taking?  Authorizing Provider  atorvastatin (LIPITOR) 40 MG tablet Take 1 tablet (40 mg total) by mouth daily. 06/19/20   Jettie Booze, MD  Cholecalciferol (VITAMIN D3) 10 MCG (400 UNIT) CAPS Take by mouth.    [provider]  clopidogrel (PLAVIX) 75 MG tablet TAKE ONE TABLET BY MOUTH DAILY WITH BREAKFAST 02/18/21   Jettie Booze, MD  LIALDA 1.2 G EC tablet Take 1.2 g by mouth daily with breakfast.  10/05/14   [provider]  nitroGLYCERIN (NITROSTAT) 0.4 MG SL tablet DISSOLVE 1 TABLET UNDER THE TONGUE EVERY 5 MINUTES AS  NEEDED FOR CHEST PAIN,NOT  TO EXCEED 3 DOSES IN 15 MIN IF PAIN PERSISTS, CALL 911 07/21/19   Jettie Booze, MD  vitamin B-12 (CYANOCOBALAMIN) 1000 MCG tablet Take 1,000 mcg by mouth daily.    [provider]   Social History   Socioeconomic History   Marital status: Married    Spouse name: Not on file   Number of children: Not on file   Years of education: Not on file   Highest education level: Not on file  Occupational History   Not on file  Tobacco Use   Smoking status: Former    Types: Cigars    Quit date: 07/27/2008    Years since quitting: 12.5   Smokeless tobacco: Never  Vaping Use   Vaping Use: Never used  Substance and Sexual Activity   Alcohol use: Yes    Comment: occassional beer or wine, < 1 per week   Drug use: Never   Sexual activity: Yes  Other Topics Concern   Not on file  Social History Narrative   Not on file   Social Determinants of Health   Financial Resource Strain: Not on file  Food Insecurity: Not on file  Transportation Needs: Not on file  Physical Activity: Not on file  Stress: Not on file  Social Connections: Not on file  Intimate Partner Violence: Not on file    Review of Systems 13 point review of systems per patient health survey noted.  Negative other than as indicated above or in HPI.    Objective:   Vitals:   02/25/21 1321  BP: 112/70  Pulse: 70  Resp: 16  Temp: 98.1 F (36.7  C)  SpO2: 98%  Weight: 183 lb (83 kg)  Height: 6' (1.829 m)     Physical Exam Vitals reviewed.  Constitutional:      Appearance: He is well-developed.  HENT:     Head: Normocephalic and atraumatic.     Right Ear: External ear normal.     Left Ear: External ear normal.  Eyes:     Conjunctiva/sclera: Conjunctivae normal.     Pupils: Pupils are equal, round, and reactive to light.  Neck:     Thyroid: No thyromegaly.  Cardiovascular:     Rate and Rhythm: Normal rate and regular rhythm.     Heart sounds: Normal heart sounds.  Pulmonary:     Effort: Pulmonary effort is normal. No respiratory distress.     Breath sounds: Normal breath sounds. No wheezing.  Abdominal:     General: There is no distension.  Palpations: Abdomen is soft.     Tenderness: There is no abdominal tenderness.  Musculoskeletal:        General: No tenderness. Normal range of motion.     Cervical back: Normal range of motion and neck supple.  Lymphadenopathy:     Cervical: No cervical adenopathy.  Skin:    General: Skin is warm and dry.     Comments: Approximately 3 to 4 mm scaled lesion on the right forearm, slightly elevated, no surrounding erythema.  Right external ear small approximately 3 mm elevated area, flesh-colored with slight hyperpigmentation at the upper pinna.  Otherwise skin intact in ears, no skin breakdown or signs of eczema.  Neurological:     Mental Status: He is alert and oriented to person, place, and time.     Deep Tendon Reflexes: Reflexes are normal and symmetric.  Psychiatric:        Behavior: Behavior normal.       Assessment & Plan:  Dale Meadows is a 60 y.o. male . Physical exam  - -anticipatory guidance as below in AVS, screening labs above. Health maintenance items as above in HPI discussed/recommended as applicable.   -COVID-vaccine booster and pneumonia vaccine booster discussed if he has not had pneumonia vaccine in the past 5 years.  Prostate cancer  ([place])  -Followed by urology with question of possible prostate cancer although biopsies were reportedly negative.  Continue close follow-up with urology.  Mixed hyperlipidemia  -Tolerating current meds, has upcoming follow-up with cardiology and lipid testing/labs at that time.  No labs today.  Skin lesion of right arm - Plan: Ambulatory referral to Dermatology  -Scaled lesion of right forearm, small elevated slightly hyperpigmented area right external ear, refer to dermatology.  No orders of the defined types were placed in this encounter.  Patient Instructions  I recommend bivalent covid booster, and pneumonia vaccine if prior vaccine over 5 years ago.   Keep follow up with specialists as planned.  I will refer you to dermatology as we discussed.  Labs as planned with cardiology.  If any concerns or elevated blood sugar I can check some other labs.  Let me know if there are questions.  Follow-up in 1 year for physical as long as everything is stable.  Preventive Care 94-45 Years Old, Male Preventive care refers to lifestyle choices and visits with your health care provider that can promote health and wellness. This includes: A yearly physical exam. This is also called an annual wellness visit. Regular dental and eye exams. Immunizations. Screening for certain conditions. Healthy lifestyle choices, such as: Eating a healthy diet. Getting regular exercise. Not using drugs or products that contain nicotine and tobacco. Limiting alcohol use. What can I expect for my preventive care visit? Physical exam Your health care provider will check your: Height and weight. These may be used to calculate your BMI (body mass index). BMI is a measurement that tells if you are at a healthy weight. Heart rate and blood pressure. Body temperature. Skin for abnormal spots. Counseling Your health care provider may ask you questions about your: Past medical problems. Family's medical  history. Alcohol, tobacco, and drug use. Emotional well-being. Home life and relationship well-being. Sexual activity. Diet, exercise, and sleep habits. Work and work Statistician. Access to firearms. What immunizations do I need? Vaccines are usually given at various ages, according to a schedule. Your health care provider will recommend vaccines for you based on your age, medical history, and lifestyle or other factors, such  as travel or where you work. What tests do I need? Blood tests Lipid and cholesterol levels. These may be checked every 5 years, or more often if you are over 74 years old. Hepatitis C test. Hepatitis B test. Screening Lung cancer screening. You may have this screening every year starting at age 78 if you have a 30-pack-year history of smoking and currently smoke or have quit within the past 15 years. Prostate cancer screening. Recommendations will vary depending on your family history and other risks. Genital exam to check for testicular cancer or hernias. Colorectal cancer screening. All adults should have this screening starting at age 62 and continuing until age 46. Your health care provider may recommend screening at age 59 if you are at increased risk. You will have tests every 1-10 years, depending on your results and the type of screening test. Diabetes screening. This is done by checking your blood sugar (glucose) after you have not eaten for a while (fasting). You may have this done every 1-3 years. STD (sexually transmitted disease) testing, if you are at risk. Follow these instructions at home: Eating and drinking  Eat a diet that includes fresh fruits and vegetables, whole grains, lean protein, and low-fat dairy products. Take vitamin and mineral supplements as recommended by your health care provider. Do not drink alcohol if your health care provider tells you not to drink. If you drink alcohol: Limit how much you have to 0-2 drinks a day. Be  aware of how much alcohol is in your drink. In the [place] one drink equals one 12 oz bottle of beer (355 mL), one 5 oz glass of wine (148 mL), or one 1 oz glass of hard liquor (44 mL). Lifestyle Take daily care of your teeth and gums. Brush your teeth every morning and night with fluoride toothpaste. Floss one time each day. Stay active. Exercise for at least 30 minutes 5 or more days each week. Do not use any products that contain nicotine or tobacco, such as cigarettes, e-cigarettes, and chewing tobacco. If you need help quitting, ask your health care provider. Do not use drugs. If you are sexually active, practice safe sex. Use a condom or other form of protection to prevent STIs (sexually transmitted infections). If told by your health care provider, take low-dose aspirin daily starting at age 83. Find healthy ways to cope with stress, such as: Meditation, yoga, or listening to music. Journaling. Talking to a trusted person. Spending time with friends and family. Safety Always wear your seat belt while driving or riding in a vehicle. Do not drive: If you have been drinking alcohol. Do not ride with someone who has been drinking. When you are tired or distracted. While texting. Wear a helmet and other protective equipment during sports activities. If you have firearms in your house, make sure you follow all gun safety procedures. What's next? Go to your health care provider once a year for an annual wellness visit. Ask your health care provider how often you should have your eyes and teeth checked. Stay up to date on all vaccines. This information is not intended to replace advice given to you by your health care provider. Make sure you discuss any questions you have with your health care provider. Document Revised: 06/22/2020 Document Reviewed: 04/08/2018 Elsevier Patient Education  2022 [place],   Merri Ray, MD [place], [facility] 02/25/21 2:08 PM

## 2021-02-26 NOTE — Progress Notes (Signed)
Cardiology Office Note   Date:  02/27/2021   ID:  Dale Meadows, DOB 1960/10/26, MRN [id number]  PCP:  Wendie Agreste, MD    No chief complaint on file.  CAD  Wt Readings from Last 3 Encounters:  02/27/21 185 lb (83.9 kg)  02/25/21 183 lb (83 kg)  10/24/20 195 lb (88.5 kg)       History of Present Illness: Dale Meadows is a 60 y.o. 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  Unable to be opened successfully.    At the time of cath in 2015, he had a patent left coronary artery system. There are left to right collaterals feeding the distal RCA territory.   He moved back to [place], [place], from [place]. He had been laid off in [place].  He retired, but worked part time for a not for [employer].  In 2018, he fully retired.    In 2022, he has lost weight. He has been eating from a vegetarian meal service (blackbeans, chick peas, cous cous).  Fiber intake limited by ulcerative colitis.     BP controlled at home in the 962-229 systolic range.    Denies : Chest pain. Dizziness. Leg edema. Nitroglycerin use. Orthopnea. Palpitations. Paroxysmal nocturnal dyspnea. Shortness of breath. Syncope.      Past Medical History:  Diagnosis Date   Allergy    CAD (coronary artery disease)    a. s/p inferior STEMI (07/2013)   Cancer ([place]) 05/29/2013   prostate   Hematuria    HLD (hyperlipidemia)    Junctional bradycardia    Myocardial infarction ([place])    Seizures ([place])    last seizure 26 years ago   Ulcerative colitis ([place])    Ulcerative colitis ([place])     Past Surgical History:  Procedure Laterality Date   ANTERIOR CRUCIATE LIGAMENT REPAIR Right 1990   BIOPSY PROSTATE     LEFT HEART CATHETERIZATION WITH CORONARY ANGIOGRAM N/A 07/27/2013   Procedure: LEFT HEART CATHETERIZATION WITH CORONARY ANGIOGRAM;  Surgeon: Jettie Booze, MD;  [facility] CATH LAB;  Service: Cardiovascular;  Laterality: N/A;   LEFT HEART CATHETERIZATION WITH  CORONARY ANGIOGRAM N/A 07/29/2013   Procedure: LEFT HEART CATHETERIZATION WITH CORONARY ANGIOGRAM;  Surgeon: Sinclair Grooms, MD;  Location: [facility] CATH LAB;  Service: Cardiovascular;  Laterality: N/A;     Current Outpatient Medications  Medication Sig Dispense Refill   atorvastatin (LIPITOR) 40 MG tablet Take 1 tablet (40 mg total) by mouth daily. 90 tablet 3   Cholecalciferol (VITAMIN D3) 10 MCG (400 UNIT) CAPS Take by mouth.     clopidogrel (PLAVIX) 75 MG tablet TAKE ONE TABLET BY MOUTH DAILY WITH BREAKFAST 90 tablet 1   LIALDA 1.2 G EC tablet Take 1.2 g by mouth daily with breakfast.      nitroGLYCERIN (NITROSTAT) 0.4 MG SL tablet DISSOLVE 1 TABLET UNDER THE TONGUE EVERY 5 MINUTES AS  NEEDED FOR CHEST PAIN,NOT  TO EXCEED 3 DOSES IN 15 MIN IF PAIN PERSISTS, CALL 911 100 tablet 0   vitamin B-12 (CYANOCOBALAMIN) 1000 MCG tablet Take 1,000 mcg by mouth daily.     No current facility-administered medications for this visit.    Allergies:   Patient has no known allergies.    Social History:  The patient  reports that he quit smoking about 12 years ago. His smoking use included cigars. He has never used smokeless tobacco. He reports current alcohol use. He reports that he does  not use drugs.   Family History:  The patient's family history includes Alcohol abuse in his brother and father; Arthritis in his father; Asthma in his father; Deep vein thrombosis in his brother; Diabetes in his mother; Diabetes type II in his mother; Drug abuse in his brother; Hearing loss in his father; Hepatitis C in his mother; Hypertension in his brother, brother, father, and mother.    ROS:  Please see the history of present illness.   Otherwise, review of systems are positive for intentional weight loss.   All other systems are reviewed and negative.    PHYSICAL EXAM: VS:  BP 100/64   Pulse 60   Ht 6' (1.829 m)   Wt 185 lb (83.9 kg)   SpO2 95%   BMI 25.09 kg/m  , BMI Body mass index is 25.09 kg/m. GEN:  Well nourished, well developed, in no acute distress HEENT: normal Neck: no JVD, carotid bruits, or masses Cardiac: RRR; no murmurs, rubs, or gallops,no edema  Respiratory:  clear to auscultation bilaterally, normal work of breathing GI: soft, nontender, nondistended, + BS MS: no deformity or atrophy Skin: warm and dry, no rash Neuro:  Strength and sensation are intact Psych: euthymic mood, full affect   EKG:   The ekg ordered today demonstrates NSR, inferior Q waves   Recent Labs: No results found for requested labs within last 8760 hours.   Lipid Panel    Component Value Date/Time   CHOL 183 02/22/2020 0824   TRIG 131 02/22/2020 0824   HDL 45 02/22/2020 0824   CHOLHDL 4.1 02/22/2020 0824   CHOLHDL 4 01/03/2015 0817   VLDL 16.8 01/03/2015 0817   LDLCALC 115 (H) 02/22/2020 0824     Other studies Reviewed: Additional studies/ records that were reviewed today with results demonstrating: labs reviewed.   ASSESSMENT AND PLAN:  CAD/Old MI: No angina. Continue aggressive secondary prevention.  Continue healthy lifestyle with regular exercise and healthy diet. Hyperlipidemia: Check lipids with PMD.  LFTs were ok.  WIll defer blood draw as there may be other labs checked.  Continue atorvastatin.  Ulcerative colitis: Controlled. Dilated root: Aortic root diameter 39 mm which is just over the upper limits of normal.  Stable in 2020.  Blood pressure well controlled.  Would not add beta-blocker given his relatively low blood pressure.   Current medicines are reviewed at length with the patient today.  The patient concerns regarding his medicines were addressed.  The following changes have been made:  No change  Labs/ tests ordered today include:  No orders of the defined types were placed in this encounter.   Recommend 150 minutes/week of aerobic exercise Low fat, low carb, high fiber diet recommended  Disposition:   FU in 1 year   Signed, Larae Grooms, MD   02/27/2021 8:15 AM    [facility] [place], [place], [place]  [address] Phone: [phone]; Fax: [phone]

## 2021-02-27 ENCOUNTER — Telehealth: Payer: Self-pay

## 2021-02-27 ENCOUNTER — Encounter: Payer: Self-pay | Admitting: Interventional Cardiology

## 2021-02-27 ENCOUNTER — Ambulatory Visit (INDEPENDENT_AMBULATORY_CARE_PROVIDER_SITE_OTHER): Payer: BC Managed Care – PPO | Admitting: Interventional Cardiology

## 2021-02-27 ENCOUNTER — Other Ambulatory Visit: Payer: Self-pay

## 2021-02-27 VITALS — BP 100/64 | HR 60 | Ht 72.0 in | Wt 185.0 lb

## 2021-02-27 DIAGNOSIS — Z0000 Encounter for general adult medical examination without abnormal findings: Secondary | ICD-10-CM

## 2021-02-27 DIAGNOSIS — E785 Hyperlipidemia, unspecified: Secondary | ICD-10-CM

## 2021-02-27 DIAGNOSIS — I5042 Chronic combined systolic (congestive) and diastolic (congestive) heart failure: Secondary | ICD-10-CM

## 2021-02-27 DIAGNOSIS — Z131 Encounter for screening for diabetes mellitus: Secondary | ICD-10-CM

## 2021-02-27 DIAGNOSIS — Z130 Encounter for screening for diseases of the blood and blood-forming organs and certain disorders involving the immune mechanism: Secondary | ICD-10-CM

## 2021-02-27 DIAGNOSIS — I77810 Thoracic aortic ectasia: Secondary | ICD-10-CM

## 2021-02-27 DIAGNOSIS — E782 Mixed hyperlipidemia: Secondary | ICD-10-CM

## 2021-02-27 DIAGNOSIS — I2510 Atherosclerotic heart disease of native coronary artery without angina pectoris: Secondary | ICD-10-CM

## 2021-02-27 NOTE — Telephone Encounter (Signed)
Caller name: [name]   On DPR? :Yes   Call back number:[phone]  Provider they see: Carlota Raspberry   Reason for call:Pt came in this past Monday to see Dr Carlota Raspberry and Pt thought he would have his heart Dr order his blood work when he went to see the heart doctor he said it was best Dr Carlota Raspberry order his blood work so pt is calling back and requesting blood work be ordered from a normal phy?

## 2021-02-27 NOTE — Telephone Encounter (Signed)
Dr Carlota Raspberry, Please advise which labs you would like ordered for patient. It is recommended by patient heart Dr to have you order labs.

## 2021-02-27 NOTE — Patient Instructions (Signed)
Medication Instructions:  Your physician recommends that you continue on your current medications as directed. Please refer to the Current Medication list given to you today.  *If you need a refill on your cardiac medications before your next appointment, please call your pharmacy*   Lab Work: None If you have labs (blood work) drawn today and your tests are completely normal, you will receive your results only by: [place] (if you have MyChart) OR A paper copy in the mail If you have any lab test that is abnormal or we need to change your treatment, we will call you to review the results.   Follow-Up: At [facility], you and your health needs are our priority.  As part of our continuing mission to provide you with exceptional heart care, we have created designated Provider Care Teams.  These Care Teams include your primary Cardiologist (physician) and Advanced Practice Providers (APPs -  Physician Assistants and Nurse Practitioners) who all work together to provide you with the care you need, when you need it.   Your next appointment:   1 year(s)  The format for your next appointment:   In Person  Provider:   Casandra Doffing, MD

## 2021-02-28 NOTE — Telephone Encounter (Signed)
Labs ordered - can schedule lab visit or [facility] lab. Thanks.

## 2021-02-28 NOTE — Addendum Note (Signed)
Addended by: Merri Ray R on: 02/28/2021 07:28 PM   Modules accepted: Orders

## 2021-03-01 NOTE — Telephone Encounter (Signed)
Message was sent to the patient via mychart to call the office and schedule a lab appointment because I do not have access to the lab schedule at [facility].  Paizleigh Wilds,cma

## 2021-03-06 ENCOUNTER — Other Ambulatory Visit (INDEPENDENT_AMBULATORY_CARE_PROVIDER_SITE_OTHER): Payer: BC Managed Care – PPO

## 2021-03-06 DIAGNOSIS — Z131 Encounter for screening for diabetes mellitus: Secondary | ICD-10-CM | POA: Diagnosis not present

## 2021-03-06 DIAGNOSIS — Z130 Encounter for screening for diseases of the blood and blood-forming organs and certain disorders involving the immune mechanism: Secondary | ICD-10-CM | POA: Diagnosis not present

## 2021-03-06 DIAGNOSIS — E782 Mixed hyperlipidemia: Secondary | ICD-10-CM | POA: Diagnosis not present

## 2021-03-06 LAB — CBC
HCT: 44.9 % (ref 39.0–52.0)
Hemoglobin: 15.3 g/dL (ref 13.0–17.0)
MCHC: 34 g/dL (ref 30.0–36.0)
MCV: 88.2 fl (ref 78.0–100.0)
Platelets: 261 10*3/uL (ref 150.0–400.0)
RBC: 5.09 Mil/uL (ref 4.22–5.81)
RDW: 13.2 % (ref 11.5–15.5)
WBC: 5.1 10*3/uL (ref 4.0–10.5)

## 2021-03-06 LAB — COMPREHENSIVE METABOLIC PANEL
ALT: 41 U/L (ref 0–53)
AST: 28 U/L (ref 0–37)
Albumin: 4.3 g/dL (ref 3.5–5.2)
Alkaline Phosphatase: 86 U/L (ref 39–117)
BUN: 14 mg/dL (ref 6–23)
CO2: 25 mEq/L (ref 19–32)
Calcium: 9 mg/dL (ref 8.4–10.5)
Chloride: 105 mEq/L (ref 96–112)
Creatinine, Ser: 0.78 mg/dL (ref 0.40–1.50)
GFR: 95.47 mL/min (ref >60.00)
Glucose, Bld: 85 mg/dL (ref 70–99)
Potassium: 4.2 mEq/L (ref 3.5–5.1)
Sodium: 139 mEq/L (ref 135–145)
Total Bilirubin: 0.7 mg/dL (ref 0.2–1.2)
Total Protein: 7 g/dL (ref 6.0–8.3)

## 2021-03-06 LAB — LIPID PANEL
Cholesterol: 157 mg/dL (ref 0–200)
HDL: 43.9 mg/dL (ref >39.00)
LDL Cholesterol: 94 mg/dL (ref 0–99)
NonHDL: 112.76
Total CHOL/HDL Ratio: 4
Triglycerides: 94 mg/dL (ref 0.0–149.0)
VLDL: 18.8 mg/dL (ref 0.0–40.0)

## 2021-03-06 LAB — HEMOGLOBIN A1C: Hgb A1c MFr Bld: 5.7 % (ref 4.6–6.5)

## 2021-04-04 DIAGNOSIS — R972 Elevated prostate specific antigen [PSA]: Secondary | ICD-10-CM | POA: Diagnosis not present

## 2021-04-10 DIAGNOSIS — R972 Elevated prostate specific antigen [PSA]: Secondary | ICD-10-CM | POA: Diagnosis not present

## 2021-04-10 DIAGNOSIS — R350 Frequency of micturition: Secondary | ICD-10-CM | POA: Diagnosis not present

## 2021-04-10 DIAGNOSIS — N401 Enlarged prostate with lower urinary tract symptoms: Secondary | ICD-10-CM | POA: Diagnosis not present

## 2021-05-13 DIAGNOSIS — L570 Actinic keratosis: Secondary | ICD-10-CM | POA: Diagnosis not present

## 2021-05-13 DIAGNOSIS — L821 Other seborrheic keratosis: Secondary | ICD-10-CM | POA: Diagnosis not present

## 2021-05-13 DIAGNOSIS — L218 Other seborrheic dermatitis: Secondary | ICD-10-CM | POA: Diagnosis not present

## 2021-05-26 ENCOUNTER — Emergency Department (HOSPITAL_BASED_OUTPATIENT_CLINIC_OR_DEPARTMENT_OTHER): Payer: BC Managed Care – PPO

## 2021-05-26 ENCOUNTER — Emergency Department (HOSPITAL_BASED_OUTPATIENT_CLINIC_OR_DEPARTMENT_OTHER)
Admission: EM | Admit: 2021-05-26 | Discharge: 2021-05-26 | Disposition: A | Discharge status: Home / Self Care | Payer: BC Managed Care – PPO | Attending: Emergency Medicine | Admitting: Emergency Medicine

## 2021-05-26 ENCOUNTER — Other Ambulatory Visit: Payer: Self-pay

## 2021-05-26 DIAGNOSIS — S0083XA Contusion of other part of head, initial encounter: Secondary | ICD-10-CM | POA: Insufficient documentation

## 2021-05-26 DIAGNOSIS — S0003XA Contusion of scalp, initial encounter: Secondary | ICD-10-CM | POA: Diagnosis not present

## 2021-05-26 DIAGNOSIS — S0990XA Unspecified injury of head, initial encounter: Secondary | ICD-10-CM | POA: Diagnosis not present

## 2021-05-26 DIAGNOSIS — Y9373 Activity, racquet and hand sports: Secondary | ICD-10-CM | POA: Insufficient documentation

## 2021-05-26 DIAGNOSIS — Z7901 Long term (current) use of anticoagulants: Secondary | ICD-10-CM | POA: Insufficient documentation

## 2021-05-26 DIAGNOSIS — W01198A Fall on same level from slipping, tripping and stumbling with subsequent striking against other object, initial encounter: Secondary | ICD-10-CM | POA: Insufficient documentation

## 2021-05-26 NOTE — ED Provider Notes (Signed)
[facility] Provider Note   CSN: [id number] Arrival date & time: 05/26/21  1553     History  Chief Complaint  Patient presents with   Head Injury    Dale Meadows is a 61 y.o. male on Plavix for history of previous MI who presents to the ED for evaluation of a head injury that occurred while playing pickle ball just prior to arrival.  Patient states that he fell backwards slamming the top of his head into the brick wall.  No loss of consciousness or laceration.  He did note some "spots "in his vision that lasted approximately 20 minutes before resolving.  He was given an ice pack by the staff at the [facility].  He has no nausea or vomiting.  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  He has no other complaints.  Denies neck pain, shortness of breath.   Head Injury Associated symptoms: headache   Associated symptoms: no seizures and no vomiting       Home Medications Prior to Admission medications   Medication Sig Start Date End Date Taking? Authorizing Provider  atorvastatin (LIPITOR) 40 MG tablet Take 1 tablet (40 mg total) by mouth daily. 06/19/20   Jettie Booze, MD  Cholecalciferol (VITAMIN D3) 10 MCG (400 UNIT) CAPS Take by mouth.    [provider]  clopidogrel (PLAVIX) 75 MG tablet TAKE ONE TABLET BY MOUTH DAILY WITH BREAKFAST 02/18/21   Jettie Booze, MD  LIALDA 1.2 G EC tablet Take 1.2 g by mouth daily with breakfast.  10/05/14   [provider]  nitroGLYCERIN (NITROSTAT) 0.4 MG SL tablet DISSOLVE 1 TABLET UNDER THE TONGUE EVERY 5 MINUTES AS  NEEDED FOR CHEST PAIN,NOT  TO EXCEED 3 DOSES IN 15 MIN IF PAIN PERSISTS, CALL 911 07/21/19   Jettie Booze, MD  vitamin B-12 (CYANOCOBALAMIN) 1000 MCG tablet Take 1,000 mcg by mouth daily.    [provider]      Allergies    Patient has no known allergies.     Review of Systems   Review of Systems  Constitutional:  Negative for fever.  HENT: Negative.    Eyes: Negative.   Respiratory:  Negative for shortness of breath.   Cardiovascular: Negative.   Gastrointestinal:  Negative for abdominal pain and vomiting.  Endocrine: Negative.   Genitourinary: Negative.   Musculoskeletal: Negative.   Skin:  Negative for rash.  Neurological:  Positive for headaches. Negative for seizures and syncope.  All other systems reviewed and are negative.  Physical Exam Updated Vital Signs BP 109/76 (BP Location: Right Arm)    Pulse 84    Temp 98.1 F (36.7 C) (Oral)    Resp 18    Ht 6' (1.829 m)    Wt 83.9 kg    SpO2 94%    BMI 25.09 kg/m  Physical Exam Vitals and nursing note reviewed.  Constitutional:      General: He is not in acute distress.    Appearance: He is not ill-appearing.  HENT:     Head:     Comments: Head has approximately 3 cm diameter contusion on the crown.  No laceration. Eyes:     Extraocular Movements: Extraocular movements intact.     Conjunctiva/sclera: Conjunctivae normal.     Pupils: Pupils are equal, round, and reactive to light.  Neck:  Comments: No midline or paraspinal C-spine tenderness.  ROM intact.  Neck supple. Cardiovascular:     Rate and Rhythm: Normal rate and regular rhythm.     Pulses: Normal pulses.     Heart sounds: No murmur heard. Pulmonary:     Effort: Pulmonary effort is normal. No respiratory distress.     Breath sounds: Normal breath sounds.  Abdominal:     General: Abdomen is flat. There is no distension.     Palpations: Abdomen is soft.     Tenderness: There is no abdominal tenderness.  Musculoskeletal:        General: Normal range of motion.     Cervical back: Normal range of motion.  Skin:    General: Skin is warm and dry.     Capillary Refill: Capillary refill takes less than 2 seconds.  Neurological:     General: No focal deficit present.     Mental Status: He is alert.     Comments:  Speech is clear, able to follow commands CN III-XII intact Normal strength in upper and lower extremities bilaterally including dorsiflexion and plantar flexion, strong and equal grip strength Sensation normal to light and sharp touch Moves extremities without ataxia, coordination intact Normal finger to nose and rapid alternating movements No pronator drift    Psychiatric:        Mood and Affect: Mood normal.    ED Results / Procedures / Treatments   Labs (all labs ordered are listed, but only abnormal results are displayed) Labs Reviewed - No data to display  EKG None  Radiology CT Head Wo Contrast  Result Date: 05/26/2021 CLINICAL DATA:  Head trauma, moderate-severe head injury on plavix; head injury on plavix EXAM: CT HEAD WITHOUT CONTRAST CT CERVICAL SPINE WITHOUT CONTRAST TECHNIQUE: Multidetector CT imaging of the head and cervical spine was performed following the standard protocol without intravenous contrast. Multiplanar CT image reconstructions of the cervical spine were also generated. RADIATION DOSE REDUCTION: This exam was performed according to the departmental dose-optimization program which includes automated exposure control, adjustment of the mA and/or kV according to patient size and/or use of iterative reconstruction technique. COMPARISON:  None. FINDINGS: CT HEAD FINDINGS Brain: No evidence of large-territorial acute infarction. No parenchymal hemorrhage. No mass lesion. No extra-axial collection. No mass effect or midline shift. No hydrocephalus. Basilar cisterns are patent. Vascular: No hyperdense vessel. Skull: No acute fracture or focal lesion. Sinuses/Orbits: Paranasal sinuses and mastoid air cells are clear. The orbits are unremarkable. Other: 5 mm left frontoparietal scalp hematoma formation. CT CERVICAL SPINE FINDINGS Alignment: Normal. Skull base and vertebrae: Multilevel at least moderate degenerative changes of the spine. Associated severe osseous neural  foraminal stenosis at the left C3-C4 level. No acute fracture. No aggressive appearing focal osseous lesion or focal pathologic process. Soft tissues and spinal canal: No prevertebral fluid or swelling. No visible canal hematoma. Upper chest: Biapical paraseptal emphysematous changes. Other: Atherosclerotic plaque of the carotid arteries. IMPRESSION: 1. No acute intracranial abnormality. 2. No acute displaced fracture or traumatic listhesis of the cervical spine. 3. Multilevel moderate degenerative changes of the spine. Associated severe osseous neural foraminal stenosis at the left C3-C4 level. Electronically Signed   By: Iven Finn M.D.   On: 05/26/2021 17:00   CT Cervical Spine Wo Contrast  Result Date: 05/26/2021 CLINICAL DATA:  Head trauma, moderate-severe head injury on plavix; head injury on plavix EXAM: CT HEAD WITHOUT CONTRAST CT CERVICAL SPINE WITHOUT CONTRAST TECHNIQUE: Multidetector CT imaging of the head  and cervical spine was performed following the standard protocol without intravenous contrast. Multiplanar CT image reconstructions of the cervical spine were also generated. RADIATION DOSE REDUCTION: This exam was performed according to the departmental dose-optimization program which includes automated exposure control, adjustment of the mA and/or kV according to patient size and/or use of iterative reconstruction technique. COMPARISON:  None. FINDINGS: CT HEAD FINDINGS Brain: No evidence of large-territorial acute infarction. No parenchymal hemorrhage. No mass lesion. No extra-axial collection. No mass effect or midline shift. No hydrocephalus. Basilar cisterns are patent. Vascular: No hyperdense vessel. Skull: No acute fracture or focal lesion. Sinuses/Orbits: Paranasal sinuses and mastoid air cells are clear. The orbits are unremarkable. Other: 5 mm left frontoparietal scalp hematoma formation. CT CERVICAL SPINE FINDINGS Alignment: Normal. Skull base and vertebrae: Multilevel at least  moderate degenerative changes of the spine. Associated severe osseous neural foraminal stenosis at the left C3-C4 level. No acute fracture. No aggressive appearing focal osseous lesion or focal pathologic process. Soft tissues and spinal canal: No prevertebral fluid or swelling. No visible canal hematoma. Upper chest: Biapical paraseptal emphysematous changes. Other: Atherosclerotic plaque of the carotid arteries. IMPRESSION: 1. No acute intracranial abnormality. 2. No acute displaced fracture or traumatic listhesis of the cervical spine. 3. Multilevel moderate degenerative changes of the spine. Associated severe osseous neural foraminal stenosis at the left C3-C4 level. Electronically Signed   By: Iven Finn M.D.   On: 05/26/2021 17:00    Procedures Procedures   Medications Ordered in ED Medications - No data to display  ED Course/ Medical Decision Making/ A&P                           Medical Decision Making Amount and/or Complexity of Data Reviewed Radiology: ordered.   History:   Dale Meadows is a 61 y.o. male on Plavix for history of previous MI who presents to the ED for evaluation of a head injury that occurred while playing pickle ball just prior to arrival.  Patient states that he fell backwards slamming the top of his head into the brick wall.  No loss of consciousness or laceration.  He did note some "spots "in his vision that lasted approximately 20 minutes before resolving.  He was given an ice pack by the staff at the [facility].  He has no nausea or vomiting.  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  He has no other complaints.  Denies neck pain, shortness of breath. This patient presents to the ED for concern of head injury while on Plavix, this involves an extensive number of treatment options, and is a complaint that carries with it a high risk of complications and  morbidity.   Emergent considerations for headache include subarachnoid hemorrhage, meningitis, temporal arteritis, glaucoma, cerebral ischemia, carotid/vertebral dissection, intracranial tumor, Venous sinus thrombosis, carbon monoxide poisoning, acute or chronic subdural hemorrhage.  Other considerations include: Migraine, Cluster headache, Hypertension, Caffeine, alcohol, or drug withdrawal, Pseudotumor cerebri, Arteriovenous malformation, Head injury, Neurocysticercosis, Post-lumbar puncture, Preeclampsia, Tension headache, Sinusitis, Cervical arthritis, Refractive error causing strain, Dental abscess, Otitis media, Temporomandibular joint syndrome, Depression, Somatoform disorder (eg, somatization) Trigeminal neuralgia, Glossopharyngeal neuralgia.   Initial impression:  Patient is well-appearing, nontoxic.  Vitals are normal.  He does have a large contusion on top of his head.  Neuro exam reassuring.  Given that he is on  Plavix, will obtain CT of head and neck to rule out intracranial bleeding or fracture.  Lab Tests and EKG:  I Ordered, reviewed, and interpreted labs and EKG.  The pertinent results in my decision-making regarding them are detailed in the ED course and/or initial impression section above.   Imaging Studies ordered:  I ordered imaging studies including CT head and C-spine which were negative for fractures or intracranial bleeding I independently visualized and interpreted imaging and I agree with the radiologist interpretation.   Disposition:  After consideration of the diagnostic results, physical exam, history and the patients response to treatment feel that the patent would benefit from discharge with strict return precautions.   Head injury: Physical exam and work-up very reassuring.  Imaging results discussed with patient and all questions were asked and answered.  We discussed return precautions.  He is to follow-up with his PCP as needed.  Discharged home in good  condition.   Final Clinical Impression(s) / ED Diagnoses Final diagnoses:  Traumatic injury of head, initial encounter    Rx / DC Orders ED Discharge Orders     None         Tonye Pearson, PA-C 05/26/21 1722    Fredia Sorrow, MD 05/26/21 2328

## 2021-05-26 NOTE — ED Triage Notes (Signed)
Pt was playing pickleball and fell backwards stricking back of head on a brick wall. Denies any LOC, ice applied to top of head. No laceration. Pt states some dizziness after but none at the current moment.

## 2021-05-26 NOTE — Discharge Instructions (Signed)
CT of your head and neck were negative for any bleeding in the brain or fractures.  There was some degenerative changes in your neck that are chronic in nature.  If this becomes an issue for you, you can follow-up with your primary care doctor.  Otherwise there is no acute traumatic injury from your fall earlier today.  You can ice the contusion on top of your head for 15 to 20 minutes at a time over the next 24 hours.  You can take Tylenol or Motrin as needed for pain.  You may notice some mild concussion symptoms for the next couple of days including dizziness, nausea.  Please return to the ED if you have sudden vision changes, altered mental status, extreme lethargy or more than 2 episodes of vomiting.

## 2021-06-24 ENCOUNTER — Other Ambulatory Visit: Payer: Self-pay | Admitting: Interventional Cardiology

## 2021-07-05 DIAGNOSIS — R972 Elevated prostate specific antigen [PSA]: Secondary | ICD-10-CM | POA: Diagnosis not present

## 2021-07-12 DIAGNOSIS — R972 Elevated prostate specific antigen [PSA]: Secondary | ICD-10-CM | POA: Diagnosis not present

## 2021-07-12 DIAGNOSIS — R350 Frequency of micturition: Secondary | ICD-10-CM | POA: Diagnosis not present

## 2021-07-12 DIAGNOSIS — N401 Enlarged prostate with lower urinary tract symptoms: Secondary | ICD-10-CM | POA: Diagnosis not present

## 2021-08-19 ENCOUNTER — Other Ambulatory Visit: Payer: Self-pay | Admitting: Interventional Cardiology

## 2021-09-01 ENCOUNTER — Emergency Department (HOSPITAL_COMMUNITY)
Admission: EM | Admit: 2021-09-01 | Discharge: 2021-09-01 | Disposition: A | Discharge status: Home / Self Care | Payer: BC Managed Care – PPO | Attending: Emergency Medicine | Admitting: Emergency Medicine

## 2021-09-01 ENCOUNTER — Emergency Department (HOSPITAL_COMMUNITY): Payer: BC Managed Care – PPO

## 2021-09-01 ENCOUNTER — Other Ambulatory Visit: Payer: Self-pay

## 2021-09-01 ENCOUNTER — Encounter (HOSPITAL_COMMUNITY): Payer: Self-pay

## 2021-09-01 DIAGNOSIS — R55 Syncope and collapse: Secondary | ICD-10-CM | POA: Diagnosis not present

## 2021-09-01 DIAGNOSIS — I2510 Atherosclerotic heart disease of native coronary artery without angina pectoris: Secondary | ICD-10-CM | POA: Insufficient documentation

## 2021-09-01 DIAGNOSIS — J9811 Atelectasis: Secondary | ICD-10-CM | POA: Diagnosis not present

## 2021-09-01 DIAGNOSIS — Z7902 Long term (current) use of antithrombotics/antiplatelets: Secondary | ICD-10-CM | POA: Insufficient documentation

## 2021-09-01 DIAGNOSIS — Z8546 Personal history of malignant neoplasm of prostate: Secondary | ICD-10-CM | POA: Insufficient documentation

## 2021-09-01 DIAGNOSIS — I959 Hypotension, unspecified: Secondary | ICD-10-CM | POA: Diagnosis not present

## 2021-09-01 DIAGNOSIS — R0602 Shortness of breath: Secondary | ICD-10-CM | POA: Insufficient documentation

## 2021-09-01 DIAGNOSIS — M25062 Hemarthrosis, left knee: Secondary | ICD-10-CM | POA: Insufficient documentation

## 2021-09-01 DIAGNOSIS — M7989 Other specified soft tissue disorders: Secondary | ICD-10-CM | POA: Diagnosis not present

## 2021-09-01 DIAGNOSIS — R42 Dizziness and giddiness: Secondary | ICD-10-CM | POA: Insufficient documentation

## 2021-09-01 DIAGNOSIS — R112 Nausea with vomiting, unspecified: Secondary | ICD-10-CM | POA: Insufficient documentation

## 2021-09-01 DIAGNOSIS — X509XXA Other and unspecified overexertion or strenuous movements or postures, initial encounter: Secondary | ICD-10-CM | POA: Diagnosis not present

## 2021-09-01 DIAGNOSIS — R110 Nausea: Secondary | ICD-10-CM | POA: Diagnosis not present

## 2021-09-01 DIAGNOSIS — M25562 Pain in left knee: Secondary | ICD-10-CM | POA: Diagnosis not present

## 2021-09-01 LAB — BASIC METABOLIC PANEL
Anion gap: 6 (ref 5–15)
BUN: 15 mg/dL (ref 8–23)
CO2: 22 mmol/L (ref 22–32)
Calcium: 8.5 mg/dL — ABNORMAL LOW (ref 8.9–10.3)
Chloride: 109 mmol/L (ref 98–111)
Creatinine, Ser: 0.96 mg/dL (ref 0.61–1.24)
GFR, Estimated: >60 mL/min (ref >60)
Glucose, Bld: 123 mg/dL — ABNORMAL HIGH (ref 70–99)
Potassium: 3.7 mmol/L (ref 3.5–5.1)
Sodium: 137 mmol/L (ref 135–145)

## 2021-09-01 LAB — CBC
HCT: 42.9 % (ref 39.0–52.0)
Hemoglobin: 14.8 g/dL (ref 13.0–17.0)
MCH: 30.6 pg (ref 26.0–34.0)
MCHC: 34.5 g/dL (ref 30.0–36.0)
MCV: 88.6 fL (ref 80.0–100.0)
Platelets: 266 10*3/uL (ref 150–400)
RBC: 4.84 MIL/uL (ref 4.22–5.81)
RDW: 12.3 % (ref 11.5–15.5)
WBC: 6.1 10*3/uL (ref 4.0–10.5)
nRBC: 0 % (ref 0.0–0.2)

## 2021-09-01 LAB — TROPONIN I (HIGH SENSITIVITY)
Troponin I (High Sensitivity): 7 ng/L (ref <18)
Troponin I (High Sensitivity): 7 ng/L (ref <18)

## 2021-09-01 MED ORDER — ACETAMINOPHEN 500 MG PO TABS
1000.0000 mg | ORAL_TABLET | Freq: Once | ORAL | Status: AC
  Administered 2021-09-01: 1000 mg via ORAL
  Filled 2021-09-01 (×2): qty 2

## 2021-09-01 NOTE — ED Triage Notes (Signed)
Pt arrives via EMS from home. Pt had a syncopal episode pta. Endorses dizziness, nausea, sob and left leg swelling. No injuries from the syncopal episode. Pt AxOx4.  ?

## 2021-09-01 NOTE — Progress Notes (Signed)
Orthopedic Tech Progress Note ?Patient Details:  ?Bowe Sidor ?[birth year] ?[id number] ? ?Ortho Devices ?Type of Ortho Device: Crutches, Knee Immobilizer, Ace wrap ?Ortho Device/Splint Location: lle knee immobilizer with compressive dressing. ?Ortho Device/Splint Interventions: Ordered, Application, Adjustment ?  ?Post Interventions ?Patient Tolerated: Well ?Instructions Provided: Care of device, Adjustment of device ? ?Karolee Stamps ?09/01/2021, 9:23 PM ? ?

## 2021-09-01 NOTE — Discharge Instructions (Addendum)
Your blood work was reassuring today.  Imaging of your knee shows a large effusion and collection of blood.  I suspect you have some sort of soft tissue injury within the knee. ? ?You should wear compressive wrap and knee immobilizer and use crutches.  Use Tylenol 1000 mg every 6 hours as needed for pain and apply ice to help with swelling.  Please call to schedule close follow-up appointment with Dr. Erlinda Hong as you will likely need an MRI of your knee as an outpatient. ? ?I would like for you to follow-up with your cardiologist regarding the syncopal episode that you had today.  If you have recurrent episodes of syncope please return for reevaluation. ?

## 2021-09-01 NOTE — ED Notes (Signed)
Ortho tech notified. ? ?

## 2021-09-01 NOTE — ED Provider Notes (Signed)
?[facility] ?Provider Note ? ? ?CSN: [id number] ?Arrival date & time: 09/01/21  1636 ? ?  ? ?History ? ?Chief Complaint  ?Patient presents with  ? Loss of Consciousness  ? Knee Pain  ? ? ?Dale Meadows is a 61 y.o. male. ? ?Dale Meadows is a 61 y.o. male with PMH of CAD, STEMI s/p PCI in 2015, junctional bradycardia, remote history of an isolated seizure, and prostate cancer presents with acute left knee swelling and syncopal episode. Pt states he was walking down stairs this afternoon when he felt a pop in his left knee without pain. Pt states he went to sit down and noticed rapid swelling of the left knee and began feeling associated tightness. Shortly after the patient went to the bathroom, stood up from the toilet, had pain in the left knee and began feeling lightheaded and dizzy and sat down on a chair. Pt states he then passed out and lost consciousness for about 20 seconds before regaining consciousness. Pts wife witnessed the episode and denies the patient falling, seizure-like activity, bowel or bladder incontinence, post-ictal state. Pt notes associated heavy breathing prior to passing out, diaphoresis, and an episode of nausea and vomiting after regaining consciousness. Pt denies HA, confusion, change in speech, numbness, tingling, weakness, subsequent lightheadedness or syncope, shortness of breath, palpitations, chest pain, abdominal pain, change in bowel or urinary habits. Pt states he has had adequate fluid intake and ate this afternoon. No prolonged periods of no movement. No recent travel. No recent trauma. No known personal or family history of clotting disordered or bleeding diatheses.  ? ?The history is provided by the patient and the spouse.  ?Loss of Consciousness ?Associated symptoms: nausea and vomiting   ?Associated symptoms: no chest pain, no dizziness, no fever, no headaches, no palpitations, no seizures, no shortness of breath and no weakness    ?Shortness of Breath ?Associated symptoms: syncope and vomiting   ?Associated symptoms: no abdominal pain, no chest pain, no cough, no fever and no headaches   ? ?  ? ?Home Medications ?Prior to Admission medications   ?Medication Sig Start Date End Date Taking? Authorizing Provider  ?atorvastatin (LIPITOR) 40 MG tablet TAKE ONE TABLET BY MOUTH DAILY 06/24/21   Jettie Booze, MD  ?Cholecalciferol (VITAMIN D3) 10 MCG (400 UNIT) CAPS Take by mouth.    [provider]  ?clopidogrel (PLAVIX) 75 MG tablet TAKE ONE TABLET BY MOUTH DAILY WITH BREAKFAST 08/20/21   Jettie Booze, MD  ?LIALDA 1.2 G EC tablet Take 1.2 g by mouth daily with breakfast.  10/05/14   [provider]  ?nitroGLYCERIN (NITROSTAT) 0.4 MG SL tablet DISSOLVE 1 TABLET UNDER THE TONGUE EVERY 5 MINUTES AS  NEEDED FOR CHEST PAIN,NOT  TO EXCEED 3 DOSES IN 15 MIN IF PAIN PERSISTS, CALL 911 07/21/19   Jettie Booze, MD  ?vitamin B-12 (CYANOCOBALAMIN) 1000 MCG tablet Take 1,000 mcg by mouth daily.    [provider]  ?   ? ?Allergies    ?Patient has no known allergies.   ? ?Review of Systems   ?Review of Systems  ?Constitutional:  Negative for chills and fever.  ?Respiratory:  Negative for cough and shortness of breath.   ?Cardiovascular:  Positive for leg swelling and syncope. Negative for chest pain and palpitations.  ?Gastrointestinal:  Positive for nausea and vomiting. Negative for abdominal pain.  ?Musculoskeletal:  Positive for arthralgias and joint swelling.  ?Skin:  Negative for color change and  wound.  ?Neurological:  Positive for syncope and light-headedness. Negative for dizziness, seizures, weakness and headaches.  ? ?Physical Exam ?Updated Vital Signs ?BP 113/65   Pulse 63   Temp 97.8 ?F (36.6 ?C) (Oral)   Resp 19   Ht 6' (1.829 m)   Wt 83.9 kg   SpO2 96%   BMI 25.09 kg/m?  ?Physical Exam ?Vitals and nursing note reviewed.  ?Constitutional:   ?   General: He is not in acute distress. ?   Appearance:  Normal appearance. He is well-developed and normal weight. He is not ill-appearing or diaphoretic.  ?HENT:  ?   Head: Normocephalic and atraumatic.  ?Eyes:  ?   General:     ?   Right eye: No discharge.     ?   Left eye: No discharge.  ?   Pupils: Pupils are equal, round, and reactive to light.  ?Cardiovascular:  ?   Rate and Rhythm: Normal rate and regular rhythm.  ?   Pulses: Normal pulses.  ?   Heart sounds: Normal heart sounds.  ?Pulmonary:  ?   Effort: Pulmonary effort is normal. No respiratory distress.  ?   Breath sounds: Normal breath sounds. No wheezing or rales.  ?   Comments: Respirations equal and unlabored, patient able to speak in full sentences, lungs clear to auscultation bilaterally  ?Chest:  ?   Chest wall: No tenderness.  ?Abdominal:  ?   General: Bowel sounds are normal. There is no distension.  ?   Palpations: Abdomen is soft. There is no mass.  ?   Tenderness: There is no abdominal tenderness. There is no guarding.  ?   Comments: Abdomen soft, nondistended, nontender to palpation in all quadrants without guarding or peritoneal signs  ?Musculoskeletal:     ?   General: No deformity.  ?   Cervical back: Neck supple.  ?   Right lower leg: No tenderness. No edema.  ?   Left lower leg: No tenderness. No edema.  ?   Comments: Palpable effusion, erythema, warmth or skin changes, patient is able to flex and extend the knee somewhat but range of motion is limited due to swelling and some pain, no swelling or tenderness in the calf or thigh.  Distal pulses 2+ normal sensation and strength.  ?Skin: ?   General: Skin is warm and dry.  ?   Capillary Refill: Capillary refill takes less than 2 seconds.  ?Neurological:  ?   Mental Status: He is alert and oriented to person, place, and time.  ?   Coordination: Coordination normal.  ?   Comments: Speech is clear, able to follow commands ?Moves extremities without ataxia, coordination intact  ?Psychiatric:     ?   Mood and Affect: Mood normal.     ?   Behavior:  Behavior normal.  ? ? ?ED Results / Procedures / Treatments   ?Labs ?(all labs ordered are listed, but only abnormal results are displayed) ?Labs Reviewed  ?BASIC METABOLIC PANEL - Abnormal; Notable for the following components:  ?    Result Value  ? Glucose, Bld 123 (*)   ? Calcium 8.5 (*)   ? All other components within normal limits  ?CBC  ?URINALYSIS, ROUTINE W REFLEX MICROSCOPIC  ?TROPONIN I (HIGH SENSITIVITY)  ?TROPONIN I (HIGH SENSITIVITY)  ? ? ?EKG ?EKG Interpretation ? ?Date/Time:  Sunday Sep 01 2021 16:45:33 EDT ?Ventricular Rate:  57 ?PR Interval:  236 ?QRS Duration: 120 ?QT Interval:  493 ?QTC  Calculation: 481 ?R Axis:   62 ?Text Interpretation: Sinus rhythm Prolonged PR interval Nonspecific intraventricular conduction delay Inferior infarct, old No significant change was found Confirmed by Ezequiel Essex [phone]) on 09/01/2021 4:50:23 PM ? ?Radiology ?CT Knee Left Wo Contrast ? ?Result Date: 09/01/2021 ?CLINICAL DATA:  Left leg swelling.  Knee effusion.  No known injury. EXAM: CT OF THE LEFT KNEE WITHOUT CONTRAST TECHNIQUE: Multidetector CT imaging of the left knee was performed according to the standard protocol. Multiplanar CT image reconstructions were also generated. RADIATION DOSE REDUCTION: This exam was performed according to the departmental dose-optimization program which includes automated exposure control, adjustment of the mA and/or kV according to patient size and/or use of iterative reconstruction technique. COMPARISON:  Same-day x-ray FINDINGS: Bones/Joint/Cartilage No acute fracture. No malalignment. Joint spaces are preserved. Large knee joint hemarthrosis with layering blood products in the suprapatellar pouch. No fat-fluid level to suggest lipohemarthrosis. Tiny Baker's cyst. Ligaments Suboptimally assessed by CT. Muscles and Tendons Musculotendinous structures appear within normal limits by CT. Extensor mechanism intact. Soft tissues Extra-articular soft tissues appear within normal  limits by CT. IMPRESSION: Large knee joint hemarthrosis with layering blood products in the suprapatellar pouch. Etiology of this finding is undetermined by CT. No evidence of fracture. Correlate with patient's

## 2021-09-02 ENCOUNTER — Telehealth: Payer: Self-pay | Admitting: Orthopaedic Surgery

## 2021-09-02 ENCOUNTER — Encounter: Payer: Self-pay | Admitting: Registered Nurse

## 2021-09-02 ENCOUNTER — Ambulatory Visit (INDEPENDENT_AMBULATORY_CARE_PROVIDER_SITE_OTHER): Payer: BC Managed Care – PPO | Admitting: Registered Nurse

## 2021-09-02 ENCOUNTER — Other Ambulatory Visit: Payer: Self-pay

## 2021-09-02 ENCOUNTER — Telehealth (HOSPITAL_COMMUNITY): Payer: Self-pay | Admitting: Emergency Medicine

## 2021-09-02 VITALS — BP 98/72 | HR 77 | Temp 98.1°F | Resp 18 | Ht 72.0 in | Wt 190.0 lb

## 2021-09-02 DIAGNOSIS — M25062 Hemarthrosis, left knee: Secondary | ICD-10-CM | POA: Diagnosis not present

## 2021-09-02 NOTE — Patient Instructions (Addendum)
Dale Meadows - ? ?Great to meet you ? ?Move around every 1-2 hours ?No strenuous exercise ?RICE method - rest, ice, compress, elevate ? ?See Dr. Erlinda Hong Wednesday. Based on imaging, likely underlying soft tissue injury (meniscus or quad tendon, for example) that will need to be monitored ? ?Tylenol 1087m every 8 hours as needed ? ?Thanks, ? ?Rich  ? ? ? ?If you have lab work done today you will be contacted with your lab results within the next 2 weeks.  If you have not heard from [facility] please contact [place] The fastest way to get your results is to register for My Chart. ? ? ?IF you received an x-ray today, you will receive an invoice from [facility]. Please contact [facility] at [phone] questions or concerns regarding your invoice.  ? ?IF you received labwork today, you will receive an invoice from [facility] Please contact [facility] at [phone] questions or concerns regarding your invoice.  ? ?Our billing staff will not be able to assist you with questions regarding bills from these companies. ? ?You will be contacted with the lab results as soon as they are available. The fastest way to get your results is to activate your My Chart account. Instructions are located on the last page of this paperwork. If you have not heard from [place] the results in 2 weeks, please contact this office. ?  ? ? ?

## 2021-09-02 NOTE — Telephone Encounter (Signed)
Sure [employer]

## 2021-09-02 NOTE — Telephone Encounter (Signed)
Pt was seen in the ER and was told to call the office for an appt- I scheduled for Thursday -however the pts wife states that the swelling has went from the knee to the foot, can we get them in sooner  ?

## 2021-09-02 NOTE — Telephone Encounter (Signed)
Followup phone call. Patient states swelling in leg has progressed and L foot is now swollen. Denies pain, numbness, tingling, chest pain, SOB, dizziness. Calf still feels soft. Patient states unable to see Dr. Erlinda Hong until 5/11. Advised patient he should be seen sooner for recheck of worsening swelling and repeat CBC. He will contact his PCP. If no appointments available in next 24-48 hours, advised to return to urgent care or ED for a recheck. He stated understanding. ?

## 2021-09-02 NOTE — Telephone Encounter (Signed)
Will work him in wed at [employer] ?

## 2021-09-02 NOTE — Progress Notes (Signed)
? ?Acute Office Visit ? ?Subjective:  ? ? Patient ID: Dale Meadows, male    DOB: 1960-12-31, 61 y.o.   MRN: [id number] ? ?Chief Complaint  ?Patient presents with  ? Hospitalization Follow-up  ?  Patient states he is here for a ER follow up. Patient states he had some knee swelling last night and now has moved to the foot.  ? ? ?HPI ?Patient is in today for HFU ? ?Seen in ER yesterday. ?Was walking down stairs when he noted painless "pop" in L knee.  ?Noted acute swelling and tightness.  ?He then went to the bathroom, where on rising from the toilet, he felt dizzy, short of breath, sat in a chair, and lost consciousness for 15-20s ?Woke and had nausea, episode of vomiting.  ? ?He did not have any chest pain, headache, visual changes, incontinence, seizure like activity, change in speech, facial drooping. ? ?He notes today that swelling has increased in lower extremity distal to knee  ?No ongoing pain ?He does have swelling in knee ?No distal numbness, weakness, redness ?No paresthesia or paralysis ? ?No new syncope, nvd, loc, dizziness, chest pain ? ?Outpatient Medications Prior to Visit  ?Medication Sig Dispense Refill  ? atorvastatin (LIPITOR) 40 MG tablet TAKE ONE TABLET BY MOUTH DAILY 90 tablet 3  ? Cholecalciferol (VITAMIN D3) 10 MCG (400 UNIT) CAPS Take by mouth.    ? clopidogrel (PLAVIX) 75 MG tablet TAKE ONE TABLET BY MOUTH DAILY WITH BREAKFAST 90 tablet 1  ? LIALDA 1.2 G EC tablet Take 1.2 g by mouth daily with breakfast.     ? nitroGLYCERIN (NITROSTAT) 0.4 MG SL tablet DISSOLVE 1 TABLET UNDER THE TONGUE EVERY 5 MINUTES AS  NEEDED FOR CHEST PAIN,NOT  TO EXCEED 3 DOSES IN 15 MIN IF PAIN PERSISTS, CALL 911 100 tablet 0  ? vitamin B-12 (CYANOCOBALAMIN) 1000 MCG tablet Take 1,000 mcg by mouth daily.    ? ?No facility-administered medications prior to visit.  ? ? ?Review of Systems ?Per hpi  ? ?   ?Objective:  ?  ?BP 98/72   Pulse 77   Temp 98.1 ?F (36.7 ?C) (Temporal)   Resp 18   Ht 6' (1.829 m)   Wt 190  lb (86.2 kg)   SpO2 96%   BMI 25.77 kg/m?  ?Physical Exam ?Constitutional:   ?   General: He is not in acute distress. ?   Appearance: Normal appearance. He is normal weight. He is not ill-appearing, toxic-appearing or diaphoretic.  ?Cardiovascular:  ?   Rate and Rhythm: Normal rate and regular rhythm.  ?   Heart sounds: Normal heart sounds. No murmur heard. ?  No friction rub. No gallop.  ?Pulmonary:  ?   Effort: Pulmonary effort is normal. No respiratory distress.  ?   Breath sounds: Normal breath sounds. No stridor. No wheezing, rhonchi or rales.  ?Chest:  ?   Chest wall: No tenderness.  ?Musculoskeletal:     ?   General: Swelling, tenderness (superior to L knee.) and deformity present. Normal range of motion.  ?   Right lower leg: No edema.  ?   Left lower leg: No edema.  ?   Comments: Neurovascularly intact throughout LLE. Mild swelling around L ankle, but barely remarkable. Cap refill in toes <2s  ?Skin: ?   General: Skin is warm and dry.  ?   Capillary Refill: Capillary refill takes less than 2 seconds.  ?   Coloration: Skin is not jaundiced or pale.  ?  Findings: No bruising, erythema, lesion or rash.  ?Neurological:  ?   General: No focal deficit present.  ?   Mental Status: He is alert and oriented to person, place, and time. Mental status is at baseline.  ?   Sensory: No sensory deficit.  ?   Motor: No weakness.  ?Psychiatric:     ?   Mood and Affect: Mood normal.     ?   Behavior: Behavior normal.     ?   Thought Content: Thought content normal.     ?   Judgment: Judgment normal.  ? ? ? ?No results found for any visits on 09/02/21. ? ? ?   ?Assessment & Plan:  ?1. Hemarthrosis of knee, left ? ? ? ?No orders of the defined types were placed in this encounter. ? ? ?Return if symptoms worsen or fail to improve. ? ?PLAN ?No evidence of worsening. Neurovascularly intact. Will need follow up with ortho this week - looks like planned for Wednesday. ?Advised to use tylenol 1076m po q8h prn, stay well  hydrated, move regularly when awake to lower risk for clot ?Patient encouraged to call clinic with any questions, comments, or concerns. ? ? ?RMaximiano Coss NP ?

## 2021-09-04 ENCOUNTER — Encounter: Payer: Self-pay | Admitting: Orthopaedic Surgery

## 2021-09-04 ENCOUNTER — Ambulatory Visit (INDEPENDENT_AMBULATORY_CARE_PROVIDER_SITE_OTHER): Payer: BC Managed Care – PPO | Admitting: Orthopaedic Surgery

## 2021-09-04 DIAGNOSIS — M25562 Pain in left knee: Secondary | ICD-10-CM | POA: Diagnosis not present

## 2021-09-04 NOTE — Progress Notes (Signed)
? ?Office Visit Note ?  ?Patient: Dale Meadows           ?Date of Birth: 08/28/1960           ?MRN: [id number] ?Visit Date: 09/04/2021 ?             ?Requested by: Wendie Agreste, MD ?[address] ?[place],  [place] [address] ?PCP: Wendie Agreste, MD ? ? ?Assessment & Plan: ?Visit Diagnoses:  ?1. Acute pain of left knee   ? ? ?Plan: Based on findings I suspect that he may have originally had a meniscus tear about 3 months ago while playing pickle ball which got better but then was reaggravated this past weekend.  He has already experienced improvement in his symptoms and his effusion.  We will not aspirate it today.  Compression with Ace bandage and increase activity as tolerated.  If he does continue to have symptoms or any recurrence neck step would be MRI.  Otherwise we will see him back as needed. ? ?Follow-Up Instructions: No follow-ups on file.  ? ?Orders:  ?No orders of the defined types were placed in this encounter. ? ?No orders of the defined types were placed in this encounter. ? ? ? ? Procedures: ?No procedures performed ? ? ?Clinical Data: ?No additional findings. ? ? ?Subjective: ?Chief Complaint  ?Patient presents with  ? Left Knee - Pain  ? ? ?HPI ? ?Dale Meadows is a very pleasant 61 year old gentleman follow-up from the ED for acute left knee pain.  Was walking down steps at home and felt a pop and acutely developed large joint effusion.  Takes Plavix for history of MI.  Evaluated in the ED x-rays and CT scan were negative for fracture but showed large lipohemarthrosis.  Patient states that he may have injured his knee about 3 months ago while playing pickle ball.  He developed an effusion at that time which resolved on its own.  He has not had any's symptoms until this Sunday.  He has been able to bear weight and fully extend his knee against gravity and resistance since the injury. ? ?Review of Systems  ?Constitutional: Negative.   ?All other systems reviewed and are  negative. ? ? ?Objective: ?Vital Signs: There were no vitals taken for this visit. ? ?Physical Exam ?Vitals and nursing note reviewed.  ?Constitutional:   ?   Appearance: He is well-developed.  ?HENT:  ?   Head: Normocephalic and atraumatic.  ?Eyes:  ?   Pupils: Pupils are equal, round, and reactive to light.  ?Pulmonary:  ?   Effort: Pulmonary effort is normal.  ?Abdominal:  ?   Palpations: Abdomen is soft.  ?Musculoskeletal:     ?   General: Normal range of motion.  ?   Cervical back: Neck supple.  ?Skin: ?   General: Skin is warm.  ?Neurological:  ?   Mental Status: He is alert and oriented to person, place, and time.  ?Psychiatric:     ?   Behavior: Behavior normal.     ?   Thought Content: Thought content normal.     ?   Judgment: Judgment normal.  ? ? ?Ortho Exam ? ?Examination of the left knee shows moderate joint effusion.  No signs of infection.  Range of motion is not particularly painful.  Collaterals and cruciates are stable.  Extensor mechanism intact.  Negative patellar apprehension.  Slight lateral joint line tenderness.  Negative McMurray. ? ?Specialty Comments:  ?No specialty comments  available. ? ?Imaging: ?No results found. ? ? ?PMFS History: ?Patient Active Problem List  ? Diagnosis Date Noted  ? Acute pain of left knee 09/04/2021  ? 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 CAD (coronary artery disease)   ? Chest pain 02/07/2014  ? Speech abnormality 12/29/2013  ? Acute myocardial infarction, initial episode of care [facility]) 08/10/2013  ? Prostate cancer ([place]) 08/01/2013  ? Seizures ([place])   ? Hematuria   ? Junctional bradycardia   ? STEMI (ST elevation myocardial infarction), inferior 07/27/2013  ? Ulcerative colitis ([place]) 07/27/2013  ? HLD (hyperlipidemia) 07/27/2013  ? Acute myocardial infarction of other inferior wall, initial episode of care 07/27/2013  ? Cancer ([place]) 05/29/2013  ? ?Past Medical History:  ?Diagnosis Date  ? Allergy   ? CAD (coronary artery disease)   ? a. s/p inferior STEMI  (07/2013)  ? Cancer ([place]) 05/29/2013  ? prostate  ? Hematuria   ? HLD (hyperlipidemia)   ? Junctional bradycardia   ? Myocardial infarction [facility])   ? Seizures ([place])   ? last seizure 26 years ago  ? Ulcerative colitis ([place])   ? Ulcerative colitis ([place])   ?  ?Family History  ?Problem Relation Age of Onset  ? Diabetes Mother   ? Hepatitis C Mother   ? Diabetes type II Mother   ? Hypertension Mother   ? Hearing loss Father   ? Asthma Father   ? Arthritis Father   ? Alcohol abuse Father   ? Hypertension Father   ? Drug abuse Brother   ? Alcohol abuse Brother   ? Hypertension Brother   ? Deep vein thrombosis Brother   ? Hypertension Brother   ? Heart disease Neg Hx   ? Heart attack Neg Hx   ? Stroke Neg Hx   ?  ?Past Surgical History:  ?Procedure Laterality Date  ? ANTERIOR CRUCIATE LIGAMENT REPAIR Right 1990  ? BIOPSY PROSTATE    ? LEFT HEART CATHETERIZATION WITH CORONARY ANGIOGRAM N/A 07/27/2013  ? Procedure: LEFT HEART CATHETERIZATION WITH CORONARY ANGIOGRAM;  Surgeon: Jettie Booze, MD;  Location: [facility] CATH LAB;  Service: Cardiovascular;  Laterality: N/A;  ? LEFT HEART CATHETERIZATION WITH CORONARY ANGIOGRAM N/A 07/29/2013  ? Procedure: LEFT HEART CATHETERIZATION WITH CORONARY ANGIOGRAM;  Surgeon: Sinclair Grooms, MD;  Location: [facility] CATH LAB;  Service: Cardiovascular;  Laterality: N/A;  ? ?Social History  ? ?Occupational History  ? Not on file  ?Tobacco Use  ? Smoking status: Former  ?  Types: Cigars  ?  Quit date: 07/27/2008  ?  Years since quitting: 13.1  ? Smokeless tobacco: Never  ?Vaping Use  ? Vaping Use: Never used  ?Substance and Sexual Activity  ? Alcohol use: Yes  ?  Comment: occassional beer or wine, < 1 per week  ? Drug use: Never  ? Sexual activity: Yes  ? ? ? ? ? ? ?

## 2021-09-05 ENCOUNTER — Ambulatory Visit: Payer: BC Managed Care – PPO | Admitting: Orthopaedic Surgery

## 2021-09-05 ENCOUNTER — Telehealth: Payer: Self-pay | Admitting: *Deleted

## 2021-09-05 DIAGNOSIS — I77810 Thoracic aortic ectasia: Secondary | ICD-10-CM

## 2021-09-05 DIAGNOSIS — R55 Syncope and collapse: Secondary | ICD-10-CM

## 2021-09-05 NOTE — Telephone Encounter (Signed)
Patient notified

## 2021-09-05 NOTE — Telephone Encounter (Signed)
-----   Message from Jettie Booze, MD sent at 09/04/2021 12:03 PM EDT ----- ?Fraser Din, ?Can you arrange echo for this patient.  Diagnosis- syncope ?----- Message ----- ?From: Ezequiel Essex, MD ?Sent: 09/04/2021  11:58 AM EDT ?To: Jettie Booze, MD ? ?He came to the ED after syncopal episode which was after a knee injury. Stable inferior Q waves, intervals ok. Troponin negative. He doesn't see you until July, would it be possible to get an echo before then? ?Thanks,  ?Richardson Landry ? ? ?

## 2021-09-09 ENCOUNTER — Ambulatory Visit (HOSPITAL_COMMUNITY): Payer: BC Managed Care – PPO | Attending: Cardiovascular Disease

## 2021-09-09 DIAGNOSIS — I77810 Thoracic aortic ectasia: Secondary | ICD-10-CM | POA: Diagnosis not present

## 2021-09-09 DIAGNOSIS — R55 Syncope and collapse: Secondary | ICD-10-CM | POA: Diagnosis not present

## 2021-09-09 LAB — ECHOCARDIOGRAM COMPLETE
Area-P 1/2: 1.95 cm2
S' Lateral: 3.2 cm

## 2021-09-19 DIAGNOSIS — L814 Other melanin hyperpigmentation: Secondary | ICD-10-CM | POA: Diagnosis not present

## 2021-09-19 DIAGNOSIS — L821 Other seborrheic keratosis: Secondary | ICD-10-CM | POA: Diagnosis not present

## 2021-09-19 DIAGNOSIS — D1801 Hemangioma of skin and subcutaneous tissue: Secondary | ICD-10-CM | POA: Diagnosis not present

## 2021-09-24 ENCOUNTER — Ambulatory Visit: Payer: BC Managed Care – PPO | Admitting: Orthopaedic Surgery

## 2021-10-01 ENCOUNTER — Encounter: Payer: Self-pay | Admitting: Orthopaedic Surgery

## 2021-10-01 ENCOUNTER — Ambulatory Visit (INDEPENDENT_AMBULATORY_CARE_PROVIDER_SITE_OTHER): Payer: BC Managed Care – PPO | Admitting: Orthopaedic Surgery

## 2021-10-01 ENCOUNTER — Ambulatory Visit (INDEPENDENT_AMBULATORY_CARE_PROVIDER_SITE_OTHER): Payer: BC Managed Care – PPO

## 2021-10-01 DIAGNOSIS — M79672 Pain in left foot: Secondary | ICD-10-CM

## 2021-10-01 DIAGNOSIS — M25562 Pain in left knee: Secondary | ICD-10-CM | POA: Diagnosis not present

## 2021-10-01 NOTE — Progress Notes (Signed)
Office Visit Note   Patient: Dale Meadows           Date of Birth: 03-18-1961           MRN: [id number] Visit Date: 10/01/2021              Requested by: Wendie Agreste, MD [address] [place],  [place] [address] PCP: Wendie Agreste, MD   Assessment & Plan: Visit Diagnoses:  1. Pain of left heel   2. Acute pain of left knee     Plan: In regards to the left knee with ongoing persistent effusion despite 4 weeks of rest we will need an MRI to rule out structural abnormalities.  For the left heel impression is planter fasciitis.  Treatments and management modalities were reviewed with the patient.  We will hold off on cortisone injection for now.  Follow-up after the MRI.  Follow-Up Instructions: No follow-ups on file.   Orders:  Orders Placed This Encounter  Procedures   XR Os Calcis Left   MR Knee Left w/o contrast   No orders of the defined types were placed in this encounter.     Procedures: No procedures performed   Clinical Data: No additional findings.   Subjective: Chief Complaint  Patient presents with   Left Knee - Follow-up    HPI Dale Meadows returns today for follow-up of left knee pain and new problem of left heel pain for about 3 months is better with rest and worse with activity.  Review of Systems  Constitutional: Negative.   All other systems reviewed and are negative.   Objective: Vital Signs: There were no vitals taken for this visit.  Physical Exam Vitals and nursing note reviewed.  Constitutional:      Appearance: He is well-developed.  Pulmonary:     Effort: Pulmonary effort is normal.  Abdominal:     Palpations: Abdomen is soft.  Skin:    General: Skin is warm.  Neurological:     Mental Status: He is alert and oriented to person, place, and time.  Psychiatric:        Behavior: Behavior normal.        Thought Content: Thought content normal.        Judgment: Judgment normal.    Ortho Exam Examination left  knee shows a persistent effusion.  His range of motion is much better.  Exam is otherwise unchanged.  Examination of the left foot shows central tenderness on the plantar aspect of the heel and the insertion of the plantar fascia.  Otherwise exam is unremarkable.  Specialty Comments:  No specialty comments available.  Imaging: XR Os Calcis Left  Result Date: 10/01/2021 Small ossification consistent with plantar fasciitis.    PMFS History: Patient Active Problem List   Diagnosis Date Noted   Pain of left heel 10/01/2021   Acute pain of left knee 09/04/2021   Old MI (myocardial infarction) 01/03/2015   CAD (coronary artery disease)    Chest pain 02/07/2014   Speech abnormality 12/29/2013   Acute myocardial infarction, initial episode of care ([place]) 08/10/2013   Prostate cancer ([place]) 08/01/2013   Seizures ([place])    Hematuria    Junctional bradycardia    STEMI (ST elevation myocardial infarction), inferior 07/27/2013   Ulcerative colitis ([place]) 07/27/2013   HLD (hyperlipidemia) 07/27/2013   Acute myocardial infarction of other inferior wall, initial episode of care 07/27/2013   Cancer ([place]) 05/29/2013   Past Medical History:  Diagnosis Date   Allergy    CAD (coronary artery disease)    a. s/p inferior STEMI (07/2013)   Cancer ([place]) 05/29/2013   prostate   Hematuria    HLD (hyperlipidemia)    Junctional bradycardia    Myocardial infarction ([place])    Seizures ([place])    last seizure 26 years ago   Ulcerative colitis ([place])    Ulcerative colitis ([place])     Family History  Problem Relation Age of Onset   Diabetes Mother    Hepatitis C Mother    Diabetes type II Mother    Hypertension Mother    Hearing loss Father    Asthma Father    Arthritis Father    Alcohol abuse Father    Hypertension Father    Drug abuse Brother    Alcohol abuse Brother    Hypertension Brother    Deep vein thrombosis Brother    Hypertension Brother    Heart disease Neg Hx    Heart attack Neg Hx     Stroke Neg Hx     Past Surgical History:  Procedure Laterality Date   ANTERIOR CRUCIATE LIGAMENT REPAIR Right 1990   BIOPSY PROSTATE     LEFT HEART CATHETERIZATION WITH CORONARY ANGIOGRAM N/A 07/27/2013   Procedure: LEFT HEART CATHETERIZATION WITH CORONARY ANGIOGRAM;  Surgeon: Jettie Booze, MD;  Location: [facility] CATH LAB;  Service: Cardiovascular;  Laterality: N/A;   LEFT HEART CATHETERIZATION WITH CORONARY ANGIOGRAM N/A 07/29/2013   Procedure: LEFT HEART CATHETERIZATION WITH CORONARY ANGIOGRAM;  Surgeon: Sinclair Grooms, MD;  Location: [facility] CATH LAB;  Service: Cardiovascular;  Laterality: N/A;   Social History   Occupational History   Not on file  Tobacco Use   Smoking status: Former    Types: Cigars    Quit date: 07/27/2008    Years since quitting: 13.1   Smokeless tobacco: Never  Vaping Use   Vaping Use: Never used  Substance and Sexual Activity   Alcohol use: Yes    Comment: occassional beer or wine, < 1 per week   Drug use: Never   Sexual activity: Yes

## 2021-10-05 ENCOUNTER — Other Ambulatory Visit: Payer: BC Managed Care – PPO

## 2021-10-07 ENCOUNTER — Ambulatory Visit
Admission: RE | Admit: 2021-10-07 | Discharge: 2021-10-07 | Disposition: A | Discharge status: Home / Self Care | Payer: BC Managed Care – PPO | Source: Ambulatory Visit | Attending: Orthopaedic Surgery | Admitting: Orthopaedic Surgery

## 2021-10-07 DIAGNOSIS — M25562 Pain in left knee: Secondary | ICD-10-CM

## 2021-10-22 ENCOUNTER — Encounter: Payer: Self-pay | Admitting: Orthopaedic Surgery

## 2021-10-22 ENCOUNTER — Ambulatory Visit (INDEPENDENT_AMBULATORY_CARE_PROVIDER_SITE_OTHER): Payer: BC Managed Care – PPO | Admitting: Orthopaedic Surgery

## 2021-10-22 DIAGNOSIS — M25562 Pain in left knee: Secondary | ICD-10-CM

## 2021-10-29 NOTE — Progress Notes (Unsigned)
Cardiology Office Note   Date:  10/30/2021   ID:  Dale Meadows, DOB [birth year], MRN [id number]  PCP:  Wendie Agreste, MD    No chief complaint on file.  CAD  Wt Readings from Last 3 Encounters:  10/30/21 189 lb (85.7 kg)  09/02/21 190 lb (86.2 kg)  09/01/21 185 lb (83.9 kg)       History of Present Illness: Dale Meadows is a 61 y.o. 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  Unable to be opened successfully.    At the time of cath in 2015, he had a patent left coronary artery system. There are left to right collaterals feeding the distal RCA territory.   He moved back to [place], [place], from [place]. He had been laid off in [place].  He retired, but worked part time for a not for [employer].  In 2018, he fully retired.     In 2022, he has lost weight. He has been eating from a vegetarian meal service (blackbeans, chick peas, cous cous).  Fiber intake limited by ulcerative colitis.     BP controlled at home in the 341-962 systolic range.    In May 2023, he had an episode where he was walking down the stairs and felt a pop in his knee without any pain.  He sat down and noticed rapid swelling of the left knee and then shortly thereafter went to the bathroom.  He stood up from the toilet and the left knee started hurting and he felt lightheaded and dizzy.  He sat down and felt he passed out and lost consciousness for about 20 seconds.  His wife was there.  He did not hurt himself or fall.  No seizure activity.  He had a negative cardiac work-up in the emergency room and was sent home with orthopedic follow-up.  Hemarthrosis was noted on CT.  MRI was going to be required.  Echo in 5/23 showed: "Left ventricular ejection fraction, by estimation, is 40 to 45%. The  left ventricle has mildly decreased function. The left ventricle  demonstrates regional wall motion abnormalities (see scoring  diagram/findings for description). There is mild   asymmetric left ventricular hypertrophy of the basal-septal segment. Left  ventricular diastolic parameters are consistent with Grade I diastolic  dysfunction (impaired relaxation).   2. Right ventricular systolic function is normal. The right ventricular  size is normal. Tricuspid regurgitation signal is inadequate for assessing  PA pressure.   3. The mitral valve is grossly normal. Trivial mitral valve  regurgitation. No evidence of mitral stenosis.   4. The aortic valve is tricuspid. Aortic valve regurgitation is not  visualized. No aortic stenosis is present. "  He has followed up with orthopedics and his symptoms have improved.  He had torn cartilage. No surgery planned.   He has resumed walking since his knee has improved. Denies : Chest pain. Dizziness. Leg edema. Nitroglycerin use. Orthopnea. Palpitations. Paroxysmal nocturnal dyspnea. Shortness of breath. Syncope.        Past Medical History:  Diagnosis Date   Allergy    CAD (coronary artery disease)    a. s/p inferior STEMI (07/2013)   Cancer ([place]) 05/29/2013   prostate   Hematuria    HLD (hyperlipidemia)    Junctional bradycardia    Myocardial infarction ([place])    Seizures ([place])    last seizure 26 years ago   Ulcerative colitis ([place])    Ulcerative colitis ([place])  Past Surgical History:  Procedure Laterality Date   ANTERIOR CRUCIATE LIGAMENT REPAIR Right 1990   BIOPSY PROSTATE     LEFT HEART CATHETERIZATION WITH CORONARY ANGIOGRAM N/A 07/27/2013   Procedure: LEFT HEART CATHETERIZATION WITH CORONARY ANGIOGRAM;  Surgeon: Jettie Booze, MD;  Location[facility] CATH LAB;  Service: Cardiovascular;  Laterality: N/A;   LEFT HEART CATHETERIZATION WITH CORONARY ANGIOGRAM N/A 07/29/2013   Procedure: LEFT HEART CATHETERIZATION WITH CORONARY ANGIOGRAM;  Surgeon: Sinclair Grooms, MD;  Location: [facility] CATH LAB;  Service: Cardiovascular;  Laterality: N/A;     Current Outpatient Medications  Medication Sig Dispense Refill    atorvastatin (LIPITOR) 40 MG tablet TAKE ONE TABLET BY MOUTH DAILY 90 tablet 3   Cholecalciferol (VITAMIN D3) 10 MCG (400 UNIT) CAPS Take by mouth.     clopidogrel (PLAVIX) 75 MG tablet TAKE ONE TABLET BY MOUTH DAILY WITH BREAKFAST 90 tablet 1   LIALDA 1.2 G EC tablet Take 1.2 g by mouth daily with breakfast.      nitroGLYCERIN (NITROSTAT) 0.4 MG SL tablet DISSOLVE 1 TABLET UNDER THE TONGUE EVERY 5 MINUTES AS  NEEDED FOR CHEST PAIN,NOT  TO EXCEED 3 DOSES IN 15 MIN IF PAIN PERSISTS, CALL 911 100 tablet 0   vitamin B-12 (CYANOCOBALAMIN) 1000 MCG tablet Take 1,000 mcg by mouth daily.     No current facility-administered medications for this visit.    Allergies:   Patient has no known allergies.    Social History:  The patient  reports that he quit smoking about 13 years ago. His smoking use included cigars. He has never used smokeless tobacco. He reports current alcohol use. He reports that he does not use drugs.   Family History:  The patient's family history includes Alcohol abuse in his brother and father; Arthritis in his father; Asthma in his father; Deep vein thrombosis in his brother; Diabetes in his mother; Diabetes type II in his mother; Drug abuse in his brother; Hearing loss in his father; Hepatitis C in his mother; Hypertension in his brother, brother, father, and mother.    ROS:  Please see the history of present illness.   Otherwise, review of systems are positive for recent knee problems.   All other systems are reviewed and negative.    PHYSICAL EXAM: VS:  BP 100/68   Pulse 70   Ht 6' (1.829 m)   Wt 189 lb (85.7 kg)   SpO2 96%   BMI 25.63 kg/m  , BMI Body mass index is 25.63 kg/m. GEN: Well nourished, well developed, in no acute distress HEENT: normal Neck: no JVD, carotid bruits, or masses Cardiac: RRR; no murmurs, rubs, or gallops,no edema  Respiratory:  clear to auscultation bilaterally, normal work of breathing GI: soft, nontender, nondistended, + BS MS: no  deformity or atrophy Skin: warm and dry, no rash Neuro:  Strength and sensation are intact Psych: euthymic mood, full affect   EKG:   The ekg ordered  May 2023 demonstrates normal sinus rhythm, inferior Q waves   Recent Labs: 03/06/2021: ALT 41 09/01/2021: BUN 15; Creatinine, Ser 0.96; Hemoglobin 14.8; Platelets 266; Potassium 3.7; Sodium 137   Lipid Panel    Component Value Date/Time   CHOL 157 03/06/2021 0803   CHOL 183 02/22/2020 0824   TRIG 94.0 03/06/2021 0803   HDL 43.90 03/06/2021 0803   HDL 45 02/22/2020 0824   CHOLHDL 4 03/06/2021 0803   VLDL 18.8 03/06/2021 0803   LDLCALC 94 03/06/2021 0803   LDLCALC 115 (  H) 02/22/2020 1224     Other studies Reviewed: Additional studies/ records that were reviewed today with results demonstrating: LDL 94 in 2022.   ASSESSMENT AND PLAN:  CAD/Old MI: No angina on medical therapy.  EF unchanged. He stayed on Plavix.  Continue aggressive secondary prevention. Syncope: in the setting of hemarthrosis and micturition.  Sounds like a vagal episode.  We spoke about staying well-hydrated.  He has had vagal episodes before when getting blood drawn. Hyperlipidemia: LDL 94 in 02/2021.  Check lipids today. Dilated aortic root: Normal in 5/23 by echocardiogram.  Whole food, based diet.High-fiber diet- but limited by ulcerative colitis.   Current medicines are reviewed at length with the patient today.  The patient concerns regarding his medicines were addressed.  The following changes have been made:  No change  Labs/ tests ordered today include:  No orders of the defined types were placed in this encounter.   Recommend 150 minutes/week of aerobic exercise Low fat, low carb, high fiber diet recommended  Disposition:   FU in 1 year   Signed, Larae Grooms, MD  10/30/2021 8:45 AM    [facility] [place], [place], [place]  [address] Phone: [phone]; Fax: [phone]

## 2021-10-30 ENCOUNTER — Encounter: Payer: Self-pay | Admitting: Interventional Cardiology

## 2021-10-30 ENCOUNTER — Ambulatory Visit (INDEPENDENT_AMBULATORY_CARE_PROVIDER_SITE_OTHER): Payer: BC Managed Care – PPO | Admitting: Interventional Cardiology

## 2021-10-30 VITALS — BP 100/68 | HR 70 | Ht 72.0 in | Wt 189.0 lb

## 2021-10-30 DIAGNOSIS — I77810 Thoracic aortic ectasia: Secondary | ICD-10-CM | POA: Diagnosis not present

## 2021-10-30 DIAGNOSIS — I5042 Chronic combined systolic (congestive) and diastolic (congestive) heart failure: Secondary | ICD-10-CM

## 2021-10-30 DIAGNOSIS — E782 Mixed hyperlipidemia: Secondary | ICD-10-CM | POA: Diagnosis not present

## 2021-10-30 DIAGNOSIS — I2510 Atherosclerotic heart disease of native coronary artery without angina pectoris: Secondary | ICD-10-CM | POA: Diagnosis not present

## 2021-10-30 NOTE — Patient Instructions (Signed)
Medication Instructions:  Your physician recommends that you continue on your current medications as directed. Please refer to the Current Medication list given to you today.  *If you need a refill on your cardiac medications before your next appointment, please call your pharmacy*  Lab Work: Your physician recommends that you have lab work today- CBC, CMET, and Lipid Panel  If you have labs (blood work) drawn today and your tests are completely normal, you will receive your results only by: MyChart Message (if you have MyChart) OR A paper copy in the mail If you have any lab test that is abnormal or we need to change your treatment, we will call you to review the results.  Follow-Up: At [facility], you and your health needs are our priority.  As part of our continuing mission to provide you with exceptional heart care, we have created designated Provider Care Teams.  These Care Teams include your primary Cardiologist (physician) and Advanced Practice Providers (APPs -  Physician Assistants and Nurse Practitioners) who all work together to provide you with the care you need, when you need it.  We recommend signing up for the patient portal called "MyChart".  Sign up information is provided on this After Visit Summary.  MyChart is used to connect with patients for Virtual Visits (Telemedicine).  Patients are able to view lab/test results, encounter notes, upcoming appointments, etc.  Non-urgent messages can be sent to your provider as well.   To learn more about what you can do with MyChart, go to [email or web address].    Your next appointment:   1 year(s)  The format for your next appointment:   In Person  Provider:   Larae Grooms, MD {   Important Information About [facility]

## 2021-10-31 LAB — CBC WITH DIFFERENTIAL/PLATELET
Basophils Absolute: 0 10*3/uL (ref 0.0–0.2)
Basos: 1 %
EOS (ABSOLUTE): 0.1 10*3/uL (ref 0.0–0.4)
Eos: 1 %
Hematocrit: 46.3 % (ref 37.5–51.0)
Hemoglobin: 16 g/dL (ref 13.0–17.7)
Immature Grans (Abs): 0 10*3/uL (ref 0.0–0.1)
Immature Granulocytes: 0 %
Lymphocytes Absolute: 2.1 10*3/uL (ref 0.7–3.1)
Lymphs: 33 %
MCH: 30 pg (ref 26.6–33.0)
MCHC: 34.6 g/dL (ref 31.5–35.7)
MCV: 87 fL (ref 79–97)
Monocytes Absolute: 0.5 10*3/uL (ref 0.1–0.9)
Monocytes: 8 %
Neutrophils Absolute: 3.6 10*3/uL (ref 1.4–7.0)
Neutrophils: 57 %
Platelets: 311 10*3/uL (ref 150–450)
RBC: 5.33 x10E6/uL (ref 4.14–5.80)
RDW: 12.5 % (ref 11.6–15.4)
WBC: 6.3 10*3/uL (ref 3.4–10.8)

## 2021-10-31 LAB — LIPID PANEL
Chol/HDL Ratio: 3.7 ratio (ref 0.0–5.0)
Cholesterol, Total: 154 mg/dL (ref 100–199)
HDL: 42 mg/dL (ref >39)
LDL Chol Calc (NIH): 89 mg/dL (ref 0–99)
Triglycerides: 127 mg/dL (ref 0–149)
VLDL Cholesterol Cal: 23 mg/dL (ref 5–40)

## 2021-10-31 LAB — COMPREHENSIVE METABOLIC PANEL
ALT: 36 IU/L (ref 0–44)
AST: 32 IU/L (ref 0–40)
Albumin/Globulin Ratio: 1.7 (ref 1.2–2.2)
Albumin: 4.5 g/dL (ref 3.8–4.8)
Alkaline Phosphatase: 91 IU/L (ref 44–121)
BUN/Creatinine Ratio: 18 (ref 10–24)
BUN: 16 mg/dL (ref 8–27)
Bilirubin Total: 0.6 mg/dL (ref 0.0–1.2)
CO2: 22 mmol/L (ref 20–29)
Calcium: 9.5 mg/dL (ref 8.6–10.2)
Chloride: 107 mmol/L — ABNORMAL HIGH (ref 96–106)
Creatinine, Ser: 0.87 mg/dL (ref 0.76–1.27)
Globulin, Total: 2.7 g/dL (ref 1.5–4.5)
Glucose: 92 mg/dL (ref 70–99)
Potassium: 4.6 mmol/L (ref 3.5–5.2)
Sodium: 142 mmol/L (ref 134–144)
Total Protein: 7.2 g/dL (ref 6.0–8.5)
eGFR: 97 mL/min/{1.73_m2} (ref >59)

## 2021-11-01 DIAGNOSIS — K5150 Left sided colitis without complications: Secondary | ICD-10-CM | POA: Diagnosis not present

## 2021-11-01 DIAGNOSIS — E559 Vitamin D deficiency, unspecified: Secondary | ICD-10-CM | POA: Diagnosis not present

## 2021-11-01 DIAGNOSIS — Z8679 Personal history of other diseases of the circulatory system: Secondary | ICD-10-CM | POA: Diagnosis not present

## 2021-11-04 ENCOUNTER — Encounter: Payer: Self-pay | Admitting: Orthopedic Surgery

## 2021-11-04 ENCOUNTER — Ambulatory Visit (INDEPENDENT_AMBULATORY_CARE_PROVIDER_SITE_OTHER): Payer: BC Managed Care – PPO | Admitting: Orthopedic Surgery

## 2021-11-04 DIAGNOSIS — M720 Palmar fascial fibromatosis [Dupuytren]: Secondary | ICD-10-CM

## 2021-11-04 NOTE — Progress Notes (Signed)
Office Visit Note   Patient: Dale Meadows           Date of Birth: [birth year]           MRN: [id number] Visit Date: 11/04/2021              Requested by: Wendie Agreste, MD [address] [place],  [place] [address] PCP: Wendie Agreste, MD   Assessment & Plan: Visit Diagnoses:  1. Dupuytren's disease of palm of both hands     Plan: Patient has a approximate 15 degree contracture at the right small finger with a pretendinous cord.  Also has a pretendinous cord at the left ring finger without any contracture.  We discussed the nature of Dupuytren's disease as well as its prognosis and both conservative and surgical treatment options.  We discussed partial palmar fasciectomy, needle aponeurotomy, enzymatic fasciotomy including their associated risks and benefits.  After our discussion, he would like to continue to monitor his symptoms for now.  Despite the slight contracture.  This is not interfering with his daily activities and is not bothersome for him.  He would like to come back to the office if he changes his mind or his contracture worsens.  Follow-Up Instructions: No follow-ups on file.   Orders:  No orders of the defined types were placed in this encounter.  No orders of the defined types were placed in this encounter.     Procedures: No procedures performed   Clinical Data: No additional findings.   Subjective: Chief Complaint  Patient presents with   Right Hand - Pain   Left Hand - Pain    This is a 61 year old right-hand-dominant male who presents with Dupuytren's disease of both hands.  This was first noticed around 5 years or so ago.  His right side is more affected than the left with a slight contracture of the small finger.  He has a family history of his disease with his dad having bilateral involvement.  His dad never had any treatment for it.  This is not bothersome for him.  Is not painful.  It is not interfering with his daily activities both  at work or at home.    Review of Systems   Objective: Vital Signs: BP 115/77 (BP Location: Left Arm, Patient Position: Sitting)   Pulse 71   Ht 6' (1.829 m)   Wt 189 lb (85.7 kg)   BMI 25.63 kg/m   Physical Exam Constitutional:      Appearance: Normal appearance.  Cardiovascular:     Rate and Rhythm: Normal rate.     Pulses: Normal pulses.  Pulmonary:     Effort: Pulmonary effort is normal.  Skin:    General: Skin is warm.     Capillary Refill: Capillary refill takes less than 2 seconds.  Neurological:     Mental Status: He is alert.     Right Hand Exam   Tenderness  The patient is experiencing no tenderness.   Range of Motion  The patient has normal right wrist ROM.   Other  Erythema: absent Sensation: normal Pulse: present  Comments:  Approximately 15 degree contracture at the small finger with an associated pretendinous cord.  Nodule in line with the ring finger with no cord or contracture.   Left Hand Exam   Tenderness  The patient is experiencing no tenderness.   Range of Motion  The patient has normal left wrist ROM.  Other  Erythema: absent Sensation:  normal Pulse: present  Comments:  Pretendinous cord in line with the ring finger without associated contracture.      Specialty Comments:  No specialty comments available.  Imaging: No results found.   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   CAD (coronary artery disease)    Chest pain 02/07/2014   Speech abnormality 12/29/2013   Acute myocardial infarction, initial episode of care ([place]) 08/10/2013   Prostate cancer ([place]) 08/01/2013   Seizures ([place])    Hematuria    Junctional bradycardia    STEMI (ST elevation myocardial infarction), inferior 07/27/2013   Ulcerative colitis ([place])  07/27/2013   HLD (hyperlipidemia) 07/27/2013   Acute myocardial infarction of other inferior wall, initial episode of care 07/27/2013   Cancer ([place]) 05/29/2013   Past Medical History:  Diagnosis Date   Allergy    CAD (coronary artery disease)    a. s/p inferior STEMI (07/2013)   Cancer ([place]) 05/29/2013   prostate   Hematuria    HLD (hyperlipidemia)    Junctional bradycardia    Myocardial infarction ([place])    Seizures ([place])    last seizure 26 years ago   Ulcerative colitis ([place])    Ulcerative colitis ([place])     Family History  Problem Relation Age of Onset   Diabetes Mother    Hepatitis C Mother    Diabetes type II Mother    Hypertension Mother    Hearing loss Father    Asthma Father    Arthritis Father    Alcohol abuse Father    Hypertension Father    Drug abuse Brother    Alcohol abuse Brother    Hypertension Brother    Deep vein thrombosis Brother    Hypertension Brother    Heart disease Neg Hx    Heart attack Neg Hx    Stroke Neg Hx     Past Surgical History:  Procedure Laterality Date   ANTERIOR CRUCIATE LIGAMENT REPAIR Right 1990   BIOPSY PROSTATE     LEFT HEART CATHETERIZATION WITH CORONARY ANGIOGRAM N/A 07/27/2013   Procedure: LEFT HEART CATHETERIZATION WITH CORONARY ANGIOGRAM;  Surgeon: Jettie Booze, MD;  Location: [facility] CATH LAB;  Service: Cardiovascular;  Laterality: N/A;   LEFT HEART CATHETERIZATION WITH CORONARY ANGIOGRAM N/A 07/29/2013   Procedure: LEFT HEART CATHETERIZATION WITH CORONARY ANGIOGRAM;  Surgeon: Sinclair Grooms, MD;  Location: [facility] C[facility] LAB;  Service: Cardiovascular;  Laterality: N/A;   Social History   Occupational History   Not on file  Tobacco Use   Smoking status: Former    Types: Cigars    Quit date: 07/27/2008    Years since quitting: 13.2   Smokeless tobacco: Never  Vaping Use   Vaping Use: Never used  Substance and Sexual Activity   Alcohol use: Yes    Comment: occassional beer or wine, < 1 per week   Drug use: Never    Sexual activity: Yes

## 2021-11-13 ENCOUNTER — Ambulatory Visit: Payer: BC Managed Care – PPO | Admitting: Interventional Cardiology

## 2021-12-11 DIAGNOSIS — R7303 Prediabetes: Secondary | ICD-10-CM | POA: Diagnosis not present

## 2022-01-01 ENCOUNTER — Telehealth: Payer: Self-pay | Admitting: *Deleted

## 2022-01-01 NOTE — Telephone Encounter (Signed)
[place], MD   Preoperative team, please contact this patient and set up a phone call appointment for further preoperative risk assessment. Please obtain consent and complete medication review. Thank you for your help.   If Plavix is requested to be held, would agree that it is reasonable to hold for 5-7 days prior to procedure as long as he is not having symptoms concerning for ACS. This can be addressed at the time of the visit.   Emmaline Life, NP-C     01/01/2022, 2:33 PM [address], [address] Office [phone] Fax [phone]

## 2022-01-01 NOTE — Telephone Encounter (Signed)
Pt agreeable to plan of care for tele pre op appt 01/20/22 @ 9:40 am. Med rec and consent are done.     Patient Consent for Virtual Visit        Dale Meadows has provided verbal consent on 01/01/2022 for a virtual visit (video or telephone).   CONSENT FOR VIRTUAL VISIT FOR:  Dale Meadows  By participating in this virtual visit I agree to the following:  I hereby voluntarily request, consent and authorize [facility] and its employed or contracted physicians, physician assistants, nurse practitioners or other licensed health care professionals (the Practitioner), to provide me with telemedicine health care services (the "Services") as deemed necessary by the treating Practitioner. I acknowledge and consent to receive the Services by the Practitioner via telemedicine. I understand that the telemedicine visit will involve communicating with the Practitioner through live audiovisual communication technology and the disclosure of certain medical information by electronic transmission. I acknowledge that I have been given the opportunity to request an in-person assessment or other available alternative prior to the telemedicine visit and am voluntarily participating in the telemedicine visit.  I understand that I have the right to withhold or withdraw my consent to the use of telemedicine in the course of my care at any time, without affecting my right to future care or treatment, and that the Practitioner or I may terminate the telemedicine visit at any time. I understand that I have the right to inspect all information obtained and/or recorded in the course of the telemedicine visit and may receive copies of available information for a reasonable fee.  I understand that some of the potential risks of receiving the Services via telemedicine include:  Delay or interruption in medical evaluation due to technological equipment failure or disruption; Information transmitted may not be  sufficient (e.g. poor resolution of images) to allow for appropriate medical decision making by the Practitioner; and/or  In rare instances, security protocols could fail, causing a breach of personal health information.  Furthermore, I acknowledge that it is my responsibility to provide information about my medical history, conditions and care that is complete and accurate to the best of my ability. I acknowledge that Practitioner's advice, recommendations, and/or decision may be based on factors not within their control, such as incomplete or inaccurate data provided by me or distortions of diagnostic images or specimens that may result from electronic transmissions. I understand that the practice of medicine is not an exact science and that Practitioner makes no warranties or guarantees regarding treatment outcomes. I acknowledge that a copy of this consent can be made available to me via my patient portal ([facility]), or I can request a printed copy by calling the office of [facility].    I understand that my insurance will be billed for this visit.   I have read or had this consent read to me. I understand the contents of this consent, which adequately explains the benefits and risks of the Services being provided via telemedicine.  I have been provided ample opportunity to ask questions regarding this consent and the Services and have had my questions answered to my satisfaction. I give my informed consent for the services to be provided through the use of telemedicine in my medical care

## 2022-01-01 NOTE — Telephone Encounter (Signed)
   Pre-operative Risk Assessment    Patient Name: Dale Meadows  DOB: [birth year] MRN: [id number]      Request for Surgical Clearance    Procedure:   COLONOSCOPY   Date of Surgery:  Clearance 02/19/22                                 Surgeon:  DR. Alessandra Bevels Surgeon's Group or Practice Name:  [facility] Phone number:  [phone] Fax number:  [phone]   Type of Clearance Requested:   - Medical  - Pharmacy:  Hold Clopidogrel (Plavix) NOT INDICATED   Type of Anesthesia:   PROPOFOL   Additional requests/questions:    Astrid Divine   01/01/2022, 2:28 PM

## 2022-01-01 NOTE — Telephone Encounter (Signed)
Pt agreeable to plan of care for tele pre op appt 01/20/22 @ 9:40 am. Med rec and consent are done.

## 2022-01-20 ENCOUNTER — Ambulatory Visit: Payer: BC Managed Care – PPO | Attending: Nurse Practitioner | Admitting: Nurse Practitioner

## 2022-01-20 ENCOUNTER — Encounter: Payer: Self-pay | Admitting: Nurse Practitioner

## 2022-01-20 DIAGNOSIS — Z01810 Encounter for preprocedural cardiovascular examination: Secondary | ICD-10-CM

## 2022-01-20 NOTE — Progress Notes (Signed)
Virtual Visit via Telephone Note   Because of Dale Meadows's co-morbid illnesses, he is at least at moderate risk for complications without adequate follow up.  This format is felt to be most appropriate for this patient at this time.  The patient did not have access to video technology/had technical difficulties with video requiring transitioning to audio format only (telephone).  All issues noted in this document were discussed and addressed.  No physical exam could be performed with this format.  Please refer to the patient's chart for his consent to telehealth for [facility].  Evaluation Performed:  Preoperative cardiovascular risk assessment _____________   Date:  01/20/2022   Patient ID:  Dale Meadows, DOB 06-20-1960, MRN [id number] Patient Location:  Home Provider location:   Office  Primary Care Provider:  Wendie Agreste, MD Primary Cardiologist:  Larae Grooms, MD  Chief Complaint / Patient Profile   61 y.o. y/o male with a h/o CAD, syncope, aortic root dilation, hyperlipidemia, and ulcerative colitis who is pending colonoscopy on 02/19/2022 with Dr. Alessandra Bevels with [facility] and presents today for telephonic preoperative cardiovascular risk assessment.  Past Medical History    Past Medical History:  Diagnosis Date   Allergy    CAD (coronary artery disease)    a. s/p inferior STEMI (07/2013)   Cancer ([place]) 05/29/2013   prostate   Hematuria    HLD (hyperlipidemia)    Junctional bradycardia    Myocardial infarction ([place])    Seizures ([place])    last seizure 26 years ago   Ulcerative colitis ([place])    Ulcerative colitis ([place])    Past Surgical History:  Procedure Laterality Date   ANTERIOR CRUCIATE LIGAMENT REPAIR Right 1990   BIOPSY PROSTATE     LEFT HEART CATHETERIZATION WITH CORONARY ANGIOGRAM N/A 07/27/2013   Procedure: LEFT HEART CATHETERIZATION WITH CORONARY ANGIOGRAM;  Surgeon: Jettie Booze, MD;  Location[facility] C[facility] LAB;  Service:  Cardiovascular;  Laterality: N/A;   LEFT HEART CATHETERIZATION WITH CORONARY ANGIOGRAM N/A 07/29/2013   Procedure: LEFT HEART CATHETERIZATION WITH CORONARY ANGIOGRAM;  Surgeon: Sinclair Grooms, MD;  Location: [facility] CATH LAB;  Service: Cardiovascular;  Laterality: N/A;    Allergies  No Known Allergies  History of Present Illness    Dale Meadows is a 61 y.o. male who presents via audio/video conferencing for a telehealth visit today.  Pt was last seen in cardiology clinic on 10/30/2021 by Dr. Irish Lack.  At that time Dale Meadows was doing well. The patient is now pending procedure as outlined above. Since his last visit, he has done well from a cardiac standpoint.He denies chest pain, palpitations, dyspnea, pnd, orthopnea, n, v, dizziness, syncope, edema, weight gain, or early satiety. All other systems reviewed and are otherwise negative except as noted above.   Home Medications    Prior to Admission medications   Medication Sig Start Date End Date Taking? Authorizing Provider  atorvastatin (LIPITOR) 40 MG tablet TAKE ONE TABLET BY MOUTH DAILY 06/24/21   Jettie Booze, MD  Cholecalciferol (VITAMIN D3) 10 MCG (400 UNIT) CAPS Take by mouth.    [provider]  clopidogrel (PLAVIX) 75 MG tablet TAKE ONE TABLET BY MOUTH DAILY WITH BREAKFAST 08/20/21   Jettie Booze, MD  LIALDA 1.2 G EC tablet Take 1.2 g by mouth daily with breakfast.  10/05/14   [provider]  nitroGLYCERIN (NITROSTAT) 0.4 MG SL tablet DISSOLVE 1 TABLET UNDER THE TONGUE EVERY 5 MINUTES AS  NEEDED FOR  CHEST PAIN,NOT  TO EXCEED 3 DOSES IN 15 MIN IF PAIN PERSISTS, CALL 911 07/21/19   Jettie Booze, MD  vitamin B-12 (CYANOCOBALAMIN) 1000 MCG tablet Take 1,000 mcg by mouth daily.    [provider]    Physical Exam    Vital Signs:  Dale Meadows does not have vital signs available for review today.  Given telephonic nature of communication, physical exam is limited. AAOx3. NAD.  Normal affect.  Speech and respirations are unlabored.  Accessory Clinical Findings    None  Assessment & Plan    1.  Preoperative Cardiovascular Risk Assessment:  According to the Revised Cardiac Risk Index (RCRI), his Perioperative Risk of Major Cardiac Event is (%): 0.9. His Functional Capacity in METs is: 8.97 according to the [facility] Activity Status Index (DASI). Therefore, based on ACC/AHA guidelines, patient would be at acceptable risk for the planned procedure without further cardiovascular testing.  The patient was advised that if he develops new symptoms prior to surgery to contact our office to arrange for a follow-up visit, and he verbalized understanding.  Per office protocol, patient may hold Plavix for 5 to 7 days prior to procedure.  Please resume Plavix as soon as possible postprocedure, at the discretion of the surgeon.    A copy of this note will be routed to requesting surgeon.  Time:   Today, I have spent 5 minutes with the patient with telehealth technology discussing medical history, symptoms, and management plan.     Lenna Sciara, NP  01/20/2022, 9:53 AM

## 2022-01-23 ENCOUNTER — Ambulatory Visit (INDEPENDENT_AMBULATORY_CARE_PROVIDER_SITE_OTHER): Payer: BC Managed Care – PPO

## 2022-01-23 ENCOUNTER — Ambulatory Visit (INDEPENDENT_AMBULATORY_CARE_PROVIDER_SITE_OTHER): Payer: BC Managed Care – PPO | Admitting: Emergency Medicine

## 2022-01-23 ENCOUNTER — Encounter: Payer: Self-pay | Admitting: Emergency Medicine

## 2022-01-23 VITALS — BP 122/72 | HR 62 | Temp 97.8°F | Ht 73.0 in | Wt 189.0 lb

## 2022-01-23 DIAGNOSIS — Z23 Encounter for immunization: Secondary | ICD-10-CM | POA: Diagnosis not present

## 2022-01-23 DIAGNOSIS — R079 Chest pain, unspecified: Secondary | ICD-10-CM | POA: Diagnosis not present

## 2022-01-23 DIAGNOSIS — M898X1 Other specified disorders of bone, shoulder: Secondary | ICD-10-CM

## 2022-01-23 DIAGNOSIS — R937 Abnormal findings on diagnostic imaging of other parts of musculoskeletal system: Secondary | ICD-10-CM

## 2022-01-23 NOTE — Progress Notes (Signed)
Dale Meadows 61 y.o.   Chief Complaint  Patient presents with   Shoulder Pain    Numbness right hand     HISTORY OF PRESENT ILLNESS: This is a 61 y.o. male complaining of pain to right scapular area that started 2 days ago without any obvious triggers. Pain also radiates to right arm with some numbness to right hand. Non-smoker.  No other associated symptoms. No other, nasal medical concerns today.  HPI   Prior to Admission medications   Medication Sig Start Date End Date Taking? Authorizing Provider  atorvastatin (LIPITOR) 40 MG tablet TAKE ONE TABLET BY MOUTH DAILY 06/24/21  Yes Jettie Booze, MD  Cholecalciferol (VITAMIN D3) 10 MCG (400 UNIT) CAPS Take by mouth.   Yes [provider]  clopidogrel (PLAVIX) 75 MG tablet TAKE ONE TABLET BY MOUTH DAILY WITH BREAKFAST 08/20/21  Yes Jettie Booze, MD  LIALDA 1.2 G EC tablet Take 1.2 g by mouth daily with breakfast.  10/05/14  Yes [provider]  nitroGLYCERIN (NITROSTAT) 0.4 MG SL tablet DISSOLVE 1 TABLET UNDER THE TONGUE EVERY 5 MINUTES AS  NEEDED FOR CHEST PAIN,NOT  TO EXCEED 3 DOSES IN 15 MIN IF PAIN PERSISTS, CALL 911 07/21/19  Yes Jettie Booze, MD  vitamin B-12 (CYANOCOBALAMIN) 1000 MCG tablet Take 1,000 mcg by mouth daily.   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   CAD (coronary artery disease)    Chest pain 02/07/2014   Speech abnormality 12/29/2013   Acute myocardial infarction, initial episode of care ([place]) 08/10/2013   Prostate cancer ([place]) 08/01/2013   Seizures ([place])    Hematuria    Junctional bradycardia    STEMI (ST elevation myocardial infarction), inferior 07/27/2013   Ulcerative colitis ([place]) 07/27/2013   HLD (hyperlipidemia) 07/27/2013    Acute myocardial infarction of other inferior wall, initial episode of care 07/27/2013   Cancer ([place]) 05/29/2013    Past Medical History:  Diagnosis Date   Allergy    CAD (coronary artery disease)    a. s/p inferior STEMI (07/2013)   Cancer ([place]) 05/29/2013   prostate   Hematuria    HLD (hyperlipidemia)    Junctional bradycardia    Myocardial infarction ([place])    Seizures ([place])    last seizure 26 years ago   Ulcerative colitis ([place])    Ulcerative colitis ([place])     Past Surgical History:  Procedure Laterality Date   ANTERIOR CRUCIATE LIGAMENT REPAIR Right 1990   BIOPSY PROSTATE     LEFT HEART CATHETERIZATION WITH CORONARY ANGIOGRAM N/A 07/27/2013   Procedure: LEFT HEART CATHETERIZATION WITH CORONARY ANGIOGRAM;  Surgeon: Jettie Booze, MD;  Location: [facility] CATH LAB;  Service: Cardiovascular;  Laterality: N/A;   LEFT HEART CATHETERIZATION WITH CORONARY ANGIOGRAM N/A 07/29/2013   Procedure: LEFT HEART CATHETERIZATION WITH CORONARY ANGIOGRAM;  Surgeon: Sinclair Grooms, MD;  Location: [facility] CATH LAB;  Service: Cardiovascular;  Laterality: N/A;    Social History   Socioeconomic History   Marital status: Married    Spouse name: Not on file   Number of children: Not on file   Years of education: Not on file   Highest education level: Not on file  Occupational History   Not on  file  Tobacco Use   Smoking status: Former    Types: Cigars    Quit date: 07/27/2008    Years since quitting: 13.5   Smokeless tobacco: Never  Vaping Use   Vaping Use: Never used  Substance and Sexual Activity   Alcohol use: Yes    Comment: occassional beer or wine, < 1 per week   Drug use: Never   Sexual activity: Yes  Other Topics Concern   Not on file  Social History Narrative   Not on file   Social Determinants of Health   Financial Resource Strain: Not on file  Food Insecurity: Not on file  Transportation Needs: Not on file  Physical Activity: Not on file  Stress: Not on file  Social  Connections: Not on file  Intimate Partner Violence: Not on file    Family History  Problem Relation Age of Onset   Diabetes Mother    Hepatitis C Mother    Diabetes type II Mother    Hypertension Mother    Hearing loss Father    Asthma Father    Arthritis Father    Alcohol abuse Father    Hypertension Father    Drug abuse Brother    Alcohol abuse Brother    Hypertension Brother    Deep vein thrombosis Brother    Hypertension Brother    Heart disease Neg Hx    Heart attack Neg Hx    Stroke Neg Hx      Review of Systems  Constitutional: Negative.  Negative for chills and fever.  HENT: Negative.  Negative for congestion and sore throat.   Respiratory: Negative.  Negative for cough and shortness of breath.   Cardiovascular: Negative.  Negative for chest pain and palpitations.  Gastrointestinal:  Negative for abdominal pain, diarrhea, nausea and vomiting.  Genitourinary: Negative.  Negative for dysuria.  Skin: Negative.  Negative for rash.  Neurological: Negative.  Negative for dizziness, focal weakness and headaches.  All other systems reviewed and are negative.   Today's Vitals   01/23/22 1338  BP: 122/72  Pulse: 62  Temp: 97.8 F (36.6 C)  TempSrc: Oral  SpO2: 98%  Weight: 189 lb (85.7 kg)  Height: 6' 1"  (1.854 m)   Body mass index is 24.94 kg/m.  Physical Exam Vitals reviewed.  Constitutional:      Appearance: Normal appearance.  HENT:     Head: Normocephalic.  Eyes:     Extraocular Movements: Extraocular movements intact.  Cardiovascular:     Rate and Rhythm: Normal rate and regular rhythm.     Pulses: Normal pulses.     Heart sounds: Normal heart sounds.  Pulmonary:     Effort: Pulmonary effort is normal.     Breath sounds: Normal breath sounds.  Musculoskeletal:        General: Normal range of motion.     Cervical back: No tenderness.     Comments: Some tenderness to the right scapular area. No spine tenderness  Lymphadenopathy:      Cervical: No cervical adenopathy.  Skin:    General: Skin is warm and dry.  Neurological:     General: No focal deficit present.     Mental Status: He is alert and oriented to person, place, and time.  Psychiatric:        Mood and Affect: Mood normal.        Behavior: Behavior normal.     DG Chest 2 View  Result Date: 01/23/2022 CLINICAL DATA:  Right-sided scapular pain for 2 days. History of coronary artery disease. Nonsmoker. EXAM: CHEST - 2 VIEW COMPARISON:  09/01/2021 FINDINGS: Hyperinflation. Suspect vertebral body height loss within at least 1 upper thoracic level. Felt to be similar to 02/07/2014 but suboptimally evaluated. Midline trachea. Normal heart size and mediastinal contours. No pleural effusion or pneumothorax. Hyperinflation. Clear lungs. Mild convex right thoracic spine curvature. IMPRESSION: Hyperinflation, without acute disease. 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 Electronically Signed   By: Abigail Miyamoto M.D.   On: 01/23/2022 14:16    ASSESSMENT & PLAN: A total of 43 minutes was spent with the patient and counseling/coordination of care regarding preparing for this visit, review of available medical records, review of multiple chronic medical conditions under management, review of all medications, differential diagnosis of right scapular pain, pain management strategies, review of chest x-ray done today, prognosis, documentation, and need for follow-up.  Problem List Items Addressed This Visit       Other   Pain of right scapula - Primary    Most likely musculoskeletal pain however not responding to Tylenol very well. Unable to take NSAIDs due to long-term anticoagulation with Plavix.  Patient has cardiac history. Paresthesia to right upper extremity.  Neuropathic component to his pain.  At this time prefers not to take any prescribed analgesics. Chest x-ray shows abnormality of  upper thoracic spine. We will need MRI of thoracic spine to further define these findings. MRI request placed today.      Relevant Orders   DG Chest 2 View (Completed)   MR Thoracic Spine Wo Contrast   Abnormal x-ray of thoracic spine    Needs follow-up with MRI of the thoracic spine to further define these findings. May need orthopedic evaluation after that.      Relevant Orders   MR Thoracic Spine Wo Contrast   Patient Instructions  Health Maintenance, Male Adopting a healthy lifestyle and getting preventive care are important in promoting health and wellness. Ask your health care provider about: The right schedule for you to have regular tests and exams. Things you can do on your own to prevent diseases and keep yourself healthy. What should I know about diet, weight, and exercise? Eat a healthy diet  Eat a diet that includes plenty of vegetables, fruits, low-fat dairy products, and lean protein. Do not eat a lot of foods that are high in solid fats, added sugars, or sodium. Maintain a healthy weight Body mass index (BMI) is a measurement that can be used to identify possible weight problems. It estimates body fat based on height and weight. Your health care provider can help determine your BMI and help you achieve or maintain a healthy weight. Get regular exercise Get regular exercise. This is one of the most important things you can do for your health. Most adults should: Exercise for at least 150 minutes each week. The exercise should increase your heart rate and make you sweat (moderate-intensity exercise). Do strengthening exercises at least twice a week. This is in addition to the moderate-intensity exercise. Spend less time sitting. Even light physical activity can be beneficial. Watch cholesterol and blood lipids Have your blood tested for lipids and cholesterol at 63 years of age, then have this test every 5 years. You may need to have your cholesterol levels checked  more often if: Your lipid or cholesterol levels are high. You are older than 63 years of age.  You are at high risk for heart disease. What should I know about cancer screening? Many types of cancers can be detected early and may often be prevented. Depending on your health history and family history, you may need to have cancer screening at various ages. This may include screening for: Colorectal cancer. Prostate cancer. Skin cancer. Lung cancer. What should I know about heart disease, diabetes, and high blood pressure? Blood pressure and heart disease High blood pressure causes heart disease and increases the risk of stroke. This is more likely to develop in people who have high blood pressure readings or are overweight. Talk with your health care provider about your target blood pressure readings. Have your blood pressure checked: Every 3-5 years if you are 74-13 years of age. Every year if you are 32 years old or older. If you are between the ages of 2 and 72 and are a current or former smoker, ask your health care provider if you should have a one-time screening for abdominal aortic aneurysm (AAA). Diabetes Have regular diabetes screenings. This checks your fasting blood sugar level. Have the screening done: Once every three years after age 66 if you are at a normal weight and have a low risk for diabetes. More often and at a younger age if you are overweight or have a high risk for diabetes. What should I know about preventing infection? Hepatitis B If you have a higher risk for hepatitis B, you should be screened for this virus. Talk with your health care provider to find out if you are at risk for hepatitis B infection. Hepatitis C Blood testing is recommended for: Everyone born from [birth year] through 1967. Anyone with known risk factors for hepatitis C. Sexually transmitted infections (STIs) You should be screened each year for STIs, including gonorrhea and chlamydia, if: You are  sexually active and are younger than 63 years of age. You are older than 63 years of age and your health care provider tells you that you are at risk for this type of infection. Your sexual activity has changed since you were last screened, and you are at increased risk for chlamydia or gonorrhea. Ask your health care provider if you are at risk. Ask your health care provider about whether you are at high risk for HIV. Your health care provider may recommend a prescription medicine to help prevent HIV infection. If you choose to take medicine to prevent HIV, you should first get tested for HIV. You should then be tested every 3 months for as long as you are taking the medicine. Follow these instructions at home: Alcohol use Do not drink alcohol if your health care provider tells you not to drink. If you drink alcohol: Limit how much you have to 0-2 drinks a day. Know how much alcohol is in your drink. In the [place] one drink equals one 12 oz bottle of beer (355 mL), one 5 oz glass of wine (148 mL), or one 1 oz glass of hard liquor (44 mL). Lifestyle Do not use any products that contain nicotine or tobacco. These products include cigarettes, chewing tobacco, and vaping devices, such as e-cigarettes. If you need help quitting, ask your health care provider. Do not use street drugs. Do not share needles. Ask your health care provider for help if you need support or information about quitting drugs. General instructions Schedule regular health, dental, and eye exams. Stay current with your vaccines. Tell your health care provider if: You often feel depressed. You have  ever been abused or do not feel safe at home. Summary Adopting a healthy lifestyle and getting preventive care are important in promoting health and wellness. Follow your health care provider's instructions about healthy diet, exercising, and getting tested or screened for diseases. Follow your health care provider's instructions on  monitoring your cholesterol and blood pressure. This information is not intended to replace advice given to you by your health care provider. Make sure you discuss any questions you have with your health care provider. Document Revised: 09/03/2020 Document Reviewed: 09/03/2020 Elsevier Patient Education  [place], MD [facility]

## 2022-01-23 NOTE — Assessment & Plan Note (Signed)
Needs follow-up with MRI of the thoracic spine to further define these findings. May need orthopedic evaluation after that.

## 2022-01-23 NOTE — Assessment & Plan Note (Addendum)
Most likely musculoskeletal pain however not responding to Tylenol very well. Unable to take NSAIDs due to long-term anticoagulation with Plavix.  Patient has cardiac history. Paresthesia to right upper extremity.  Neuropathic component to his pain.  At this time prefers not to take any prescribed analgesics. Chest x-ray shows abnormality of upper thoracic spine. We will need MRI of thoracic spine to further define these findings. MRI request placed today.

## 2022-01-23 NOTE — Patient Instructions (Signed)
Health Maintenance, Male Adopting a healthy lifestyle and getting preventive care are important in promoting health and wellness. Ask your health care provider about: The right schedule for you to have regular tests and exams. Things you can do on your own to prevent diseases and keep yourself healthy. What should I know about diet, weight, and exercise? Eat a healthy diet  Eat a diet that includes plenty of vegetables, fruits, low-fat dairy products, and lean protein. Do not eat a lot of foods that are high in solid fats, added sugars, or sodium. Maintain a healthy weight Body mass index (BMI) is a measurement that can be used to identify possible weight problems. It estimates body fat based on height and weight. Your health care provider can help determine your BMI and help you achieve or maintain a healthy weight. Get regular exercise Get regular exercise. This is one of the most important things you can do for your health. Most adults should: Exercise for at least 150 minutes each week. The exercise should increase your heart rate and make you sweat (moderate-intensity exercise). Do strengthening exercises at least twice a week. This is in addition to the moderate-intensity exercise. Spend less time sitting. Even light physical activity can be beneficial. Watch cholesterol and blood lipids Have your blood tested for lipids and cholesterol at 63 years of age, then have this test every 5 years. You may need to have your cholesterol levels checked more often if: Your lipid or cholesterol levels are high. You are older than 63 years of age. You are at high risk for heart disease. What should I know about cancer screening? Many types of cancers can be detected early and may often be prevented. Depending on your health history and family history, you may need to have cancer screening at various ages. This may include screening for: Colorectal cancer. Prostate cancer. Skin cancer. Lung  cancer. What should I know about heart disease, diabetes, and high blood pressure? Blood pressure and heart disease High blood pressure causes heart disease and increases the risk of stroke. This is more likely to develop in people who have high blood pressure readings or are overweight. Talk with your health care provider about your target blood pressure readings. Have your blood pressure checked: Every 3-5 years if you are 18-39 years of age. Every year if you are 40 years old or older. If you are between the ages of 65 and 75 and are a current or former smoker, ask your health care provider if you should have a one-time screening for abdominal aortic aneurysm (AAA). Diabetes Have regular diabetes screenings. This checks your fasting blood sugar level. Have the screening done: Once every three years after age 45 if you are at a normal weight and have a low risk for diabetes. More often and at a younger age if you are overweight or have a high risk for diabetes. What should I know about preventing infection? Hepatitis B If you have a higher risk for hepatitis B, you should be screened for this virus. Talk with your health care provider to find out if you are at risk for hepatitis B infection. Hepatitis C Blood testing is recommended for: Everyone born from 1945 through 1965. Anyone with known risk factors for hepatitis C. Sexually transmitted infections (STIs) You should be screened each year for STIs, including gonorrhea and chlamydia, if: You are sexually active and are younger than 63 years of age. You are older than 63 years of age and your   health care provider tells you that you are at risk for this type of infection. Your sexual activity has changed since you were last screened, and you are at increased risk for chlamydia or gonorrhea. Ask your health care provider if you are at risk. Ask your health care provider about whether you are at high risk for HIV. Your health care provider  may recommend a prescription medicine to help prevent HIV infection. If you choose to take medicine to prevent HIV, you should first get tested for HIV. You should then be tested every 3 months for as long as you are taking the medicine. Follow these instructions at home: Alcohol use Do not drink alcohol if your health care provider tells you not to drink. If you drink alcohol: Limit how much you have to 0-2 drinks a day. Know how much alcohol is in your drink. In the [place] one drink equals one 12 oz bottle of beer (355 mL), one 5 oz glass of wine (148 mL), or one 1 oz glass of hard liquor (44 mL). Lifestyle Do not use any products that contain nicotine or tobacco. These products include cigarettes, chewing tobacco, and vaping devices, such as e-cigarettes. If you need help quitting, ask your health care provider. Do not use street drugs. Do not share needles. Ask your health care provider for help if you need support or information about quitting drugs. General instructions Schedule regular health, dental, and eye exams. Stay current with your vaccines. Tell your health care provider if: You often feel depressed. You have ever been abused or do not feel safe at home. Summary Adopting a healthy lifestyle and getting preventive care are important in promoting health and wellness. Follow your health care provider's instructions about healthy diet, exercising, and getting tested or screened for diseases. Follow your health care provider's instructions on monitoring your cholesterol and blood pressure. This information is not intended to replace advice given to you by your health care provider. Make sure you discuss any questions you have with your health care provider. Document Revised: 09/03/2020 Document Reviewed: 09/03/2020 Elsevier Patient Education  2023 Elsevier Inc.  

## 2022-01-31 ENCOUNTER — Ambulatory Visit
Admission: RE | Admit: 2022-01-31 | Discharge: 2022-01-31 | Disposition: A | Discharge status: Home / Self Care | Payer: BC Managed Care – PPO | Source: Ambulatory Visit | Attending: Emergency Medicine | Admitting: Emergency Medicine

## 2022-01-31 DIAGNOSIS — K7689 Other specified diseases of liver: Secondary | ICD-10-CM | POA: Diagnosis not present

## 2022-01-31 DIAGNOSIS — M2578 Osteophyte, vertebrae: Secondary | ICD-10-CM | POA: Diagnosis not present

## 2022-01-31 DIAGNOSIS — M898X1 Other specified disorders of bone, shoulder: Secondary | ICD-10-CM

## 2022-01-31 DIAGNOSIS — R937 Abnormal findings on diagnostic imaging of other parts of musculoskeletal system: Secondary | ICD-10-CM

## 2022-01-31 DIAGNOSIS — M4854XA Collapsed vertebra, not elsewhere classified, thoracic region, initial encounter for fracture: Secondary | ICD-10-CM | POA: Diagnosis not present

## 2022-01-31 DIAGNOSIS — M25511 Pain in right shoulder: Secondary | ICD-10-CM | POA: Diagnosis not present

## 2022-02-03 ENCOUNTER — Telehealth: Payer: Self-pay

## 2022-02-03 ENCOUNTER — Telehealth: Payer: Self-pay | Admitting: Family Medicine

## 2022-02-03 NOTE — Telephone Encounter (Signed)
Patient is calling in wanting to know if we can result the MRI so he knows what the next step would be.

## 2022-02-03 NOTE — Telephone Encounter (Signed)
Chart reviewed, patient was evaluated by Dr. Mitchel Honour, and I do see note sent to patient at 547 today.

## 2022-02-03 NOTE — Telephone Encounter (Signed)
Caller name: Dale Meadows   On DPR? :yes/no: Yes  Call back number: [phone]  Provider they see: Carlota Raspberry   Reason for call: Pt called stating that he had a MRI Friday 01/31/22 that was ordered by Dr. Agustina Caroli . Pt wants to know if he should schedule a follow up with Dr.Greene or Dr.Miguel. Pt also stated that the receptionist at Dr.Miguel's office stated that Mr.Criscuolo might have to see a Orthopedic. Pt just wants to know what he needs to do to move this process forward.

## 2022-02-03 NOTE — Telephone Encounter (Signed)
Spoke w/ Dale Meadows and advised he will need to f/u with DR Mitchel Honour since he ordered MRI and if he thinks the Dale Meadows should see Ortho he should be the one to refer  him . I explained if he does not by chance but states he needs to see Ortho I advised him to give [place] a call .

## 2022-02-05 NOTE — Telephone Encounter (Signed)
Sounds like this is a new concern, lets have him come in for an exam by Dr Carlota Raspberry see if Dr Carlota Raspberry needs any imaging and then we can get order out to ortho, but Dr Carlota Raspberry will want to examine this concern prior to referral last visit was in May

## 2022-02-05 NOTE — Telephone Encounter (Signed)
Pt called back stating that he is currently having some shoulder pain again. Pt called Dr.Sagardia's office about a referral to Ortho. Pt stated that he was told by Judson Roch front desk rep that, his referral has to be put in with his pcp.

## 2022-02-05 NOTE — Telephone Encounter (Signed)
Pt is schedule for an office visit tomorrow.

## 2022-02-06 ENCOUNTER — Ambulatory Visit (INDEPENDENT_AMBULATORY_CARE_PROVIDER_SITE_OTHER)
Admission: RE | Admit: 2022-02-06 | Discharge: 2022-02-06 | Disposition: A | Discharge status: Home / Self Care | Payer: BC Managed Care – PPO | Source: Ambulatory Visit | Attending: Family Medicine | Admitting: Family Medicine

## 2022-02-06 ENCOUNTER — Other Ambulatory Visit: Payer: BC Managed Care – PPO

## 2022-02-06 ENCOUNTER — Ambulatory Visit (INDEPENDENT_AMBULATORY_CARE_PROVIDER_SITE_OTHER): Payer: BC Managed Care – PPO | Admitting: Family Medicine

## 2022-02-06 ENCOUNTER — Encounter: Payer: Self-pay | Admitting: Family Medicine

## 2022-02-06 VITALS — BP 118/68 | HR 75 | Temp 98.4°F | Ht 73.0 in | Wt 191.8 lb

## 2022-02-06 DIAGNOSIS — M792 Neuralgia and neuritis, unspecified: Secondary | ICD-10-CM

## 2022-02-06 DIAGNOSIS — M898X1 Other specified disorders of bone, shoulder: Secondary | ICD-10-CM

## 2022-02-06 DIAGNOSIS — M47812 Spondylosis without myelopathy or radiculopathy, cervical region: Secondary | ICD-10-CM | POA: Diagnosis not present

## 2022-02-06 DIAGNOSIS — M549 Dorsalgia, unspecified: Secondary | ICD-10-CM

## 2022-02-06 DIAGNOSIS — K7689 Other specified diseases of liver: Secondary | ICD-10-CM | POA: Diagnosis not present

## 2022-02-06 DIAGNOSIS — M4802 Spinal stenosis, cervical region: Secondary | ICD-10-CM | POA: Diagnosis not present

## 2022-02-06 MED ORDER — CYCLOBENZAPRINE HCL 5 MG PO TABS: 5.0000 mg | ORAL_TABLET | Freq: Three times a day (TID) | ORAL | 1 refills | Status: DC | PRN

## 2022-02-06 NOTE — Progress Notes (Signed)
Subjective:  Patient ID: Dale Meadows, male    DOB: Jun 25, 1960  Age: 61 y.o. MRN: [id number]  CC:  Chief Complaint  Patient presents with   Shoulder Pain    Pt states he has right shoulder pain, pt states it has been hurting for 2 weeks, pt has done nothing other than taking tylenol, he states its low pain at this point , Dr saw him last Thursday and ordered an MRI and xray and said if pain continues to see an orthopedic dr. Abbott Pao wants advice on what to do next     HPI Dale Meadows presents for   R shoulder pain: Past few weeks. Started in afternoon, no known injury. Worse 1st week, treated with tylenol and rest,  pain has subsided. Only minimal pain at this time. Pain at the scapula, and some pain - nerve pain going into R arm. Did not have neck pain at the time.  Plays Pickleball, no known injury.  Minimal awareness at this time, not painful, not weak. 1-2/10 discomfort. Better with heating pad.   Seen by Dr. Mitchel Honour on 01/23/22.  Note reviewed. R scapular pain.  Neuropathic component with paresthesia to right upper extremity.  Unable to take NSAIDs due to anticoagulation. CXR 9/28: Hyperinflation, without acute disease. 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 MRI thoracic spine 02/03/2022 indicated scoliosis with mild degenerative change, no neural impingement or visible inflammation.  Mild endplate degeneration at T7-8.  Mild facet spurring on the right T4-5 and 5-6.  Liver cysts Multiple cystic intensities in the partially covered liver noted on MRI of thoracic spine.  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   CAD (coronary  artery disease)    Chest pain 02/07/2014   Speech abnormality 12/29/2013   Acute myocardial infarction, initial episode of care ([place]) 08/10/2013   Prostate cancer ([place]) 08/01/2013   Seizures ([place])    Hematuria    Junctional bradycardia    STEMI (ST elevation myocardial infarction), inferior 07/27/2013   Ulcerative colitis ([place]) 07/27/2013   HLD (hyperlipidemia) 07/27/2013   Acute myocardial infarction of other inferior wall, initial episode of care 07/27/2013   Cancer ([place]) 05/29/2013   Past Medical History:  Diagnosis Date   Allergy    CAD (coronary artery disease)    a. s/p inferior STEMI (07/2013)   Cancer ([place]) 05/29/2013   prostate   Hematuria    HLD (hyperlipidemia)    Junctional bradycardia    Myocardial infarction ([place])    Seizures ([place])    last seizure 26 years ago   Ulcerative colitis ([place])    Ulcerative colitis ([place])    Past Surgical History:  Procedure Laterality Date   ANTERIOR CRUCIATE LIGAMENT REPAIR Right 1990   BIOPSY PROSTATE     LEFT HEART CATHETERIZATION WITH CORONARY ANGIOGRAM N/A 07/27/2013   Procedure: LEFT HEART CATHETERIZATION WITH CORONARY ANGIOGRAM;  Surgeon: Jettie Booze, MD;  Location: [facility] CATH LAB;  Service: Cardiovascular;  Laterality: N/A;   LEFT HEART CATHETERIZATION WITH CORONARY ANGIOGRAM N/A 07/29/2013   Procedure: LEFT HEART CATHETERIZATION WITH CORONARY ANGIOGRAM;  Surgeon: Sinclair Grooms, MD;  Location: [facility]  CATH LAB;  Service: Cardiovascular;  Laterality: N/A;   No Known Allergies Prior to Admission medications   Medication Sig Start Date End Date Taking? Authorizing Provider  atorvastatin (LIPITOR) 40 MG tablet TAKE ONE TABLET BY MOUTH DAILY 06/24/21  Yes Jettie Booze, MD  Cholecalciferol (VITAMIN D3) 10 MCG (400 UNIT) CAPS Take by mouth.   Yes [provider]  clopidogrel (PLAVIX) 75 MG tablet TAKE ONE TABLET BY MOUTH DAILY WITH BREAKFAST 08/20/21  Yes Jettie Booze, MD  LIALDA 1.2 G EC tablet Take 1.2  g by mouth daily with breakfast.  10/05/14  Yes [provider]  nitroGLYCERIN (NITROSTAT) 0.4 MG SL tablet DISSOLVE 1 TABLET UNDER THE TONGUE EVERY 5 MINUTES AS  NEEDED FOR CHEST PAIN,NOT  TO EXCEED 3 DOSES IN 15 MIN IF PAIN PERSISTS, CALL 911 07/21/19  Yes Jettie Booze, MD  vitamin B-12 (CYANOCOBALAMIN) 1000 MCG tablet Take 1,000 mcg by mouth daily.   Yes [provider]   Social History   Socioeconomic History   Marital status: Married    Spouse name: Not on file   Number of children: Not on file   Years of education: Not on file   Highest education level: Not on file  Occupational History   Not on file  Tobacco Use   Smoking status: Former    Types: Cigars    Quit date: 07/27/2008    Years since quitting: 13.5   Smokeless tobacco: Never  Vaping Use   Vaping Use: Never used  Substance and Sexual Activity   Alcohol use: Yes    Comment: occassional beer or wine, < 1 per week   Drug use: Never   Sexual activity: Yes  Other Topics Concern   Not on file  Social History Narrative   Not on file   Social Determinants of Health   Financial Resource Strain: Not on file  Food Insecurity: Not on file  Transportation Needs: Not on file  Physical Activity: Not on file  Stress: Not on file  Social Connections: Not on file  Intimate Partner Violence: Not on file    Review of Systems Per hPI  Objective:   Vitals:   02/06/22 1339  BP: 118/68  Pulse: 75  Temp: 98.4 F (36.9 C)  SpO2: 94%  Weight: 191 lb 12.8 oz (87 kg)  Height: 6' 1"  (1.854 m)     Physical Exam Constitutional:      General: He is not in acute distress.    Appearance: Normal appearance. He is well-developed.  HENT:     Head: Normocephalic and atraumatic.  Cardiovascular:     Rate and Rhythm: Normal rate.  Pulmonary:     Effort: Pulmonary effort is normal.  Abdominal:     General: Abdomen is flat. There is no distension.     Palpations: Abdomen is soft.     Tenderness: There  is no abdominal tenderness.  Musculoskeletal:     Comments: C-spine, slight decreased motion with right greater than left rotation and lateral flexion, slight decreased extension, but pain-free and does not reproduce scapular or arm symptoms.  No midline bony tenderness of cervical spine.  No midline bony tenderness of thoracic spine or paraspinal musculature, scapula nontender.  Right shoulder full range of motion, pain-free range of motion with intact rotator cuff strength.  Unable to reproduce symptoms.  Upper extremity strength including grip strength equal bilaterally.  Neurological:     Mental Status: He is alert and oriented to person,  place, and time.  Psychiatric:        Mood and Affect: Mood normal.        Assessment & Plan:  Tekoa Amon is a 61 y.o. male . Pain of right scapula - Plan: DG Cervical Spine Complete, cyclobenzaprine (FLEXERIL) 5 MG tablet Upper back pain - Plan: cyclobenzaprine (FLEXERIL) 5 MG tablet Radicular pain in right arm - Plan: DG Cervical Spine Complete, cyclobenzaprine (FLEXERIL) 5 MG tablet  -Improving.  Based on initial symptoms including radicular symptoms in right arm, suspicious for cervical radiculopathy.  Reassuring exam at present.  Previous MRI of thoracic spine reviewed as above.  Check C-spine imaging.  Flexeril if needed for recurrence of symptoms.  Hold on prednisone, NSAIDs for now given his other medications and improvement.  Option of orthopedic evaluation or advanced imaging with MRI if persistent or recurrent symptoms.  Liver cyst - Plan: US Abdomen Limited RUQ (LIVER/GB) Incidental finding on imaging as above, check ultrasound initially.  Asymptomatic.  Meds ordered this encounter  Medications   cyclobenzaprine (FLEXERIL) 5 MG tablet    Sig: Take 1 tablet (5 mg total) by mouth 3 (three) times daily as needed for muscle spasms (start qhs prn due to sedation).    Dispense:  15 tablet    Refill:  1   Patient Instructions   Shoulder blade pain and arm pain could have been related to pinched nerve of the neck.  I am glad to hear that is improving.  I have prescribed muscle relaxer if symptoms return or worsen, start with that at bedtime as it can cause sedation.  Heat, gentle range of motion can also be helpful.  Please have x-ray performed at the [facility] location below.  I would be cautious with sports until we see some those results and pain has resolved.   I will order an ultrasound to evaluate the possible liver cysts further and let you know if there are any concerns.  Take care    Signed,   Merri Ray, MD [place] 02/06/22 2:49 PM

## 2022-02-06 NOTE — Patient Instructions (Addendum)
Shoulder blade pain and arm pain could have been related to pinched nerve of the neck.  I am glad to hear that is improving.  I have prescribed muscle relaxer if symptoms return or worsen, start with that at bedtime as it can cause sedation.  Heat, gentle range of motion can also be helpful.  Please have x-ray performed at the [facility] location below.  I would be cautious with sports until we see some those results and pain has resolved.   I will order an ultrasound to evaluate the possible liver cysts further and let you know if there are any concerns.  Take care

## 2022-02-12 ENCOUNTER — Other Ambulatory Visit: Payer: BC Managed Care – PPO

## 2022-02-12 DIAGNOSIS — R350 Frequency of micturition: Secondary | ICD-10-CM | POA: Diagnosis not present

## 2022-02-12 DIAGNOSIS — N401 Enlarged prostate with lower urinary tract symptoms: Secondary | ICD-10-CM | POA: Diagnosis not present

## 2022-02-13 ENCOUNTER — Other Ambulatory Visit: Payer: BC Managed Care – PPO

## 2022-02-14 ENCOUNTER — Ambulatory Visit
Admission: RE | Admit: 2022-02-14 | Discharge: 2022-02-14 | Disposition: A | Discharge status: Home / Self Care | Payer: BC Managed Care – PPO | Source: Ambulatory Visit | Attending: Family Medicine | Admitting: Family Medicine

## 2022-02-14 ENCOUNTER — Encounter: Payer: Self-pay | Admitting: Family Medicine

## 2022-02-14 ENCOUNTER — Other Ambulatory Visit: Payer: Self-pay | Admitting: Interventional Cardiology

## 2022-02-14 DIAGNOSIS — K7689 Other specified diseases of liver: Secondary | ICD-10-CM | POA: Diagnosis not present

## 2022-02-19 DIAGNOSIS — K5140 Inflammatory polyps of colon without complications: Secondary | ICD-10-CM | POA: Diagnosis not present

## 2022-02-19 DIAGNOSIS — K635 Polyp of colon: Secondary | ICD-10-CM | POA: Diagnosis not present

## 2022-02-19 DIAGNOSIS — K648 Other hemorrhoids: Secondary | ICD-10-CM | POA: Diagnosis not present

## 2022-02-19 DIAGNOSIS — K5289 Other specified noninfective gastroenteritis and colitis: Secondary | ICD-10-CM | POA: Diagnosis not present

## 2022-02-19 DIAGNOSIS — K644 Residual hemorrhoidal skin tags: Secondary | ICD-10-CM | POA: Diagnosis not present

## 2022-02-19 DIAGNOSIS — D123 Benign neoplasm of transverse colon: Secondary | ICD-10-CM | POA: Diagnosis not present

## 2022-02-19 DIAGNOSIS — K5730 Diverticulosis of large intestine without perforation or abscess without bleeding: Secondary | ICD-10-CM | POA: Diagnosis not present

## 2022-02-26 ENCOUNTER — Encounter: Payer: BC Managed Care – PPO | Admitting: Family Medicine

## 2022-03-07 ENCOUNTER — Ambulatory Visit (INDEPENDENT_AMBULATORY_CARE_PROVIDER_SITE_OTHER): Payer: BC Managed Care – PPO | Admitting: Family Medicine

## 2022-03-07 ENCOUNTER — Encounter: Payer: Self-pay | Admitting: Family Medicine

## 2022-03-07 VITALS — BP 118/60 | HR 77 | Temp 98.0°F | Ht 72.5 in | Wt 185.4 lb

## 2022-03-07 DIAGNOSIS — R7303 Prediabetes: Secondary | ICD-10-CM | POA: Diagnosis not present

## 2022-03-07 DIAGNOSIS — E782 Mixed hyperlipidemia: Secondary | ICD-10-CM | POA: Diagnosis not present

## 2022-03-07 DIAGNOSIS — Z131 Encounter for screening for diabetes mellitus: Secondary | ICD-10-CM | POA: Diagnosis not present

## 2022-03-07 DIAGNOSIS — Z0000 Encounter for general adult medical examination without abnormal findings: Secondary | ICD-10-CM

## 2022-03-07 DIAGNOSIS — K7689 Other specified diseases of liver: Secondary | ICD-10-CM

## 2022-03-07 LAB — COMPREHENSIVE METABOLIC PANEL
ALT: 29 U/L (ref 0–53)
AST: 26 U/L (ref 0–37)
Albumin: 4.6 g/dL (ref 3.5–5.2)
Alkaline Phosphatase: 80 U/L (ref 39–117)
BUN: 17 mg/dL (ref 6–23)
CO2: 28 mEq/L (ref 19–32)
Calcium: 9.8 mg/dL (ref 8.4–10.5)
Chloride: 104 mEq/L (ref 96–112)
Creatinine, Ser: 0.86 mg/dL (ref 0.40–1.50)
GFR: 92.05 mL/min (ref >60.00)
Glucose, Bld: 84 mg/dL (ref 70–99)
Potassium: 5.2 mEq/L — ABNORMAL HIGH (ref 3.5–5.1)
Sodium: 138 mEq/L (ref 135–145)
Total Bilirubin: 0.8 mg/dL (ref 0.2–1.2)
Total Protein: 7.5 g/dL (ref 6.0–8.3)

## 2022-03-07 LAB — HEMOGLOBIN A1C: Hgb A1c MFr Bld: 5.7 % (ref 4.6–6.5)

## 2022-03-07 NOTE — Progress Notes (Signed)
Subjective:  Patient ID: Dale Meadows, male    DOB: 1960-12-24  Age: 61 y.o. MRN: [id number]  CC:  Chief Complaint  Patient presents with   Annual Exam    Pt is fasting     HPI Dale Meadows presents for Annual Exam No acute concerns today. Prior shoulder, scapular pain improved and back playing pickleball   Care team PCP, me Hand specialist, Dr. Robina Ade, Dupuytren's disease of both hands Cardiology, Dr.Varanasi, CAD, history of STEMI.  No angina on medical therapy, continued on Plavix at his July 5 visit.  History of vagal episodes with blood draws.  Dilated aortic root with normal appearance in May by echo.  On Lipitor for hyperlipidemia. Ortho, Dr.Xu Urology, Dr. Gaynelle Arabian, now with  Dr. Alinda Money, history of elevated PSA, yearly urologic visits, suspicious area for carcinoma in 2015 but cancer not seen on biopsy. Gastroenterology, Dr. Alessandra Bevels, ulcerative colitis treated with Lialda  Lab Results  Component Value Date   CHOL 154 10/30/2021   HDL 42 10/30/2021   LDLCALC 89 10/30/2021   TRIG 127 10/30/2021   CHOLHDL 3.7 10/30/2021        03/07/2022    8:38 AM 02/06/2022    1:38 PM 01/23/2022    1:43 PM 09/02/2021   11:59 AM  Depression screen PHQ 2/9  Decreased Interest 0 0 0 0  Down, Depressed, Hopeless 0 0 0 0  PHQ - 2 Score 0 0 0 0  Altered sleeping 0 0 0 0  Tired, decreased energy 0 1 3 0  Change in appetite 0 0 0 0  Feeling bad or failure about yourself  0 0 0 0  Trouble concentrating 0 0 0 0  Moving slowly or fidgety/restless 0 0 0 0  Suicidal thoughts 0 0 0 0  PHQ-9 Score 0 1 3 0  Difficult doing work/chores   Somewhat difficult Not difficult at all    Health Maintenance  Topic Date Due   COVID-19 Vaccine (4 - Pfizer risk series) 03/23/2022 (Originally 06/05/2020)   Hepatitis C Screening  02/07/2023 (Originally 07/04/1976)   HIV Screening  02/07/2023 (Originally 07/04/1973)   COLONOSCOPY (Pts 45-63yr Insurance coverage will need to be confirmed)   05/09/2027   TETANUS/TDAP  10/25/2030   INFLUENZA VACCINE  Completed   Zoster Vaccines- Shingrix  Completed   HPV VACCINES  Aged Out  Colonoscopy - every 5 yrs, one 2 weeks ago - few polyps.  Prostate: followed by urology with recent visit. PSA in spring was doing ok, planned repeat April next year.  Derm: saw last year, freezing for precancerous areas. Yearly follow up planned.   Immunization History  Administered Date(s) Administered   Influenza,inj,Quad PF,6+ Mos 02/08/2014, 01/23/2022   PFIZER(Purple Top)SARS-COV-2 Vaccination 07/15/2019, 08/05/2019, 04/10/2020   Tdap 10/24/2020   Zoster Recombinat (Shingrix) 05/22/2020, 07/19/2020  Covid booster - declined.   No results found. Optho - year ago. Ongoing care. Glasses, contacts.   Dental: 941mo yearly.   Alcohol:none  Tobacco: none  Exercise: pickleball - 3 times per week for 1-1.5 hrs.  pl[facility] joining [employer] [employer].  Prediabetes. Has been adjusting sugar intake with program with [employer] - 5 pounds down.  Plans to schedule appt with GI for abnormalities on liver  - will let me know if referral needed.  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 11/04/2021   Pain of left heel 10/01/2021   Acute pain of left knee 09/04/2021   Old MI (myocardial infarction) 01/03/2015   CAD (coronary artery disease)    Chest pain 02/07/2014   Speech abnormality 12/29/2013   Acute myocardial infarction, initial episode of care ([place]) 08/10/2013   Prostate cancer ([place]) 08/01/2013   Seizures ([place])    Hematuria    Junctional bradycardia    STEMI (ST elevation myocardial infarction), inferior 07/27/2013   Ulcerative colitis ([place]) 07/27/2013   HLD (hyperlipidemia) 07/27/2013   Acute myocardial infarction of other inferior wall, initial  episode of care 07/27/2013   Cancer ([place]) 05/29/2013   Past Medical History:  Diagnosis Date   Allergy    CAD (coronary artery disease)    a. s/p inferior STEMI (07/2013)   Cancer ([place]) 05/29/2013   prostate   Hematuria    HLD (hyperlipidemia)    Junctional bradycardia    Myocardial infarction ([place])    Seizures ([place])    last seizure 26 years ago   Ulcerative colitis ([place])    Ulcerative colitis ([place])    Past Surgical History:  Procedure Laterality Date   ANTERIOR CRUCIATE LIGAMENT REPAIR Right 1990   BIOPSY PROSTATE     LEFT HEART CATHETERIZATION WITH CORONARY ANGIOGRAM N/A 07/27/2013   Procedure: LEFT HEART CATHETERIZATION WITH CORONARY ANGIOGRAM;  Surgeon: Jettie Booze, MD;  Location: [facility] CATH LAB;  Service: Cardiovascular;  Laterality: N/A;   LEFT HEART CATHETERIZATION WITH CORONARY ANGIOGRAM N/A 07/29/2013   Procedure: LEFT HEART CATHETERIZATION WITH CORONARY ANGIOGRAM;  Surgeon: Sinclair Grooms, MD;  Location: [facility] CATH LAB;  Service: Cardiovascular;  Laterality: N/A;   No Known Allergies Prior to Admission medications   Medication Sig Start Date End Date Taking? Authorizing Provider  atorvastatin (LIPITOR) 40 MG tablet TAKE ONE TABLET BY MOUTH DAILY 06/24/21  Yes Jettie Booze, MD  Cholecalciferol (VITAMIN D3) 10 MCG (400 UNIT) CAPS Take by mouth.   Yes [provider]  clopidogrel (PLAVIX) 75 MG tablet TAKE ONE TABLET BY MOUTH DAILY WITH BREAKFAST 02/14/22  Yes Jettie Booze, MD  LIALDA 1.2 G EC tablet Take 1.2 g by mouth daily with breakfast.  10/05/14  Yes [provider]  nitroGLYCERIN (NITROSTAT) 0.4 MG SL tablet DISSOLVE 1 TABLET UNDER THE TONGUE EVERY 5 MINUTES AS  NEEDED FOR CHEST PAIN,NOT  TO EXCEED 3 DOSES IN 15 MIN IF PAIN PERSISTS, CALL 911 07/21/19  Yes Jettie Booze, MD  vitamin B-12 (CYANOCOBALAMIN) 1000 MCG tablet Take 1,000 mcg by mouth daily.   Yes [provider]  cyclobenzaprine (FLEXERIL) 5 MG tablet Take  1 tablet (5 mg total) by mouth 3 (three) times daily as needed for muscle spasms (start qhs prn due to sedation). 02/06/22   Wendie Agreste, MD   Social History   Socioeconomic History   Marital status: Married    Spouse name: Not on file   Number of children: Not on file   Years of education: Not on file   Highest education level: Not on file  Occupational History   Not on file  Tobacco Use   Smoking status: Former    Types: Cigars    Quit date: 07/27/2008    Years since quitting: 13.6   Smokeless tobacco: Never  Vaping Use   Vaping Use: Never used  Substance and Sexual Activity   Alcohol use: Yes  Comment: occassional beer or wine, < 1 per week   Drug use: Never   Sexual activity: Yes  Other Topics Concern   Not on file  Social History Narrative   Not on file   Social Determinants of Health   Financial Resource Strain: Not on file  Food Insecurity: Not on file  Transportation Needs: Not on file  Physical Activity: Not on file  Stress: Not on file  Social Connections: Not on file  Intimate Partner Violence: Not on file    Review of Systems 13 point review of systems per patient health survey noted.  Negative other than as indicated above or in HPI.    Objective:   Vitals:   03/07/22 0835  BP: 118/60  Pulse: 77  Temp: 98 F (36.7 C)  TempSrc: Oral  SpO2: 96%  Weight: 185 lb 6.4 oz (84.1 kg)  Height: 6' 0.5" (1.842 m)     Physical Exam Vitals reviewed.  Constitutional:      Appearance: He is well-developed.  HENT:     Head: Normocephalic and atraumatic.     Right Ear: External ear normal.     Left Ear: External ear normal.  Eyes:     Conjunctiva/sclera: Conjunctivae normal.     Pupils: Pupils are equal, round, and reactive to light.  Neck:     Thyroid: No thyromegaly.  Cardiovascular:     Rate and Rhythm: Normal rate and regular rhythm.     Heart sounds: Normal heart sounds.  Pulmonary:     Effort: Pulmonary effort is normal. No  respiratory distress.     Breath sounds: Normal breath sounds. No wheezing.  Abdominal:     General: There is no distension.     Palpations: Abdomen is soft.     Tenderness: There is no abdominal tenderness.  Musculoskeletal:        General: No tenderness. Normal range of motion.     Cervical back: Normal range of motion and neck supple.  Lymphadenopathy:     Cervical: No cervical adenopathy.  Skin:    General: Skin is warm and dry.  Neurological:     Mental Status: He is alert and oriented to person, place, and time.     Deep Tendon Reflexes: Reflexes are normal and symmetric.  Psychiatric:        Behavior: Behavior normal.        Assessment & Plan:  Dale Meadows is a 61 y.o. male . Physical exam - Plan: Comprehensive metabolic panel, Hemoglobin A1c  - -anticipatory guidance as below in AVS, screening labs above. Health maintenance items as above in HPI discussed/recommended as applicable.   Mixed hyperlipidemia  -Recent labs in July stable, tolerating current regimen, prescribed by cardiology.  Screening for diabetes mellitus - Plan: Hemoglobin A1c Prediabetes  -Borderline prediabetes, has improved diet with weight loss as above.  Anticipate normalization of A1c, check labs, then recheck in 6 months.  Liver cyst  -Plans to follow-up with gastroenterology, can place referral if needed.  He will call his gastroenterologist  No orders of the defined types were placed in this encounter.  Patient Instructions  Thanks for coming in today.  Keep up the good work with watching diet and weight loss.  I expect the A1c/prediabetes test to be improved.  Continue follow-up with specialist.  Please call your gastroenterologist to follow-up on the abnormal liver findings but I am also happy to place a referral if that is needed.  I will check electrolytes along  with A1c today.  48-monthfollow-up for prediabetes and repeat labs, let me know if there are questions sooner and take care.    Preventive Care [id number] Old, Male Preventive care refers to lifestyle choices and visits with your health care provider that can promote health and wellness. Preventive care visits are also called wellness exams. What can I expect for my preventive care visit? Counseling During your preventive care visit, your health care provider may ask about your: Medical history, including: Past medical problems. Family medical history. Current health, including: Emotional well-being. Home life and relationship well-being. Sexual activity. Lifestyle, including: Alcohol, nicotine or tobacco, and drug use. Access to firearms. Diet, exercise, and sleep habits. Safety issues such as seatbelt and bike helmet use. Sunscreen use. Work and work eStatistician Physical exam Your health care provider will check your: Height and weight. These may be used to calculate your BMI (body mass index). BMI is a measurement that tells if you are at a healthy weight. Waist circumference. This measures the distance around your waistline. This measurement also tells if you are at a healthy weight and may help predict your risk of certain diseases, such as type 2 diabetes and high blood pressure. Heart rate and blood pressure. Body temperature. Skin for abnormal spots. What immunizations do I need?  Vaccines are usually given at various ages, according to a schedule. Your health care provider will recommend vaccines for you based on your age, medical history, and lifestyle or other factors, such as travel or where you work. What tests do I need? Screening Your health care provider may recommend screening tests for certain conditions. This may include: Lipid and cholesterol levels. Diabetes screening. This is done by checking your blood sugar (glucose) after you have not eaten for a while (fasting). Hepatitis B test. Hepatitis C test. HIV (human immunodeficiency virus) test. STI (sexually transmitted infection)  testing, if you are at risk. Lung cancer screening. Prostate cancer screening. Colorectal cancer screening. Talk with your health care provider about your test results, treatment options, and if necessary, the need for more tests. Follow these instructions at home: Eating and drinking  Eat a diet that includes fresh fruits and vegetables, whole grains, lean protein, and low-fat dairy products. Take vitamin and mineral supplements as recommended by your health care provider. Do not drink alcohol if your health care provider tells you not to drink. If you drink alcohol: Limit how much you have to 0-2 drinks a day. Know how much alcohol is in your drink. In the [place] one drink equals one 12 oz bottle of beer (355 mL), one 5 oz glass of wine (148 mL), or one 1 oz glass of hard liquor (44 mL). Lifestyle Brush your teeth every morning and night with fluoride toothpaste. Floss one time each day. Exercise for at least 30 minutes 5 or more days each week. Do not use any products that contain nicotine or tobacco. These products include cigarettes, chewing tobacco, and vaping devices, such as e-cigarettes. If you need help quitting, ask your health care provider. Do not use drugs. If you are sexually active, practice safe sex. Use a condom or other form of protection to prevent STIs. Take aspirin only as told by your health care provider. Make sure that you understand how much to take and what form to take. Work with your health care provider to find out whether it is safe and beneficial for you to take aspirin daily. Find healthy ways to manage stress, such as:  Meditation, yoga, or listening to music. Journaling. Talking to a trusted person. Spending time with friends and family. Minimize exposure to UV radiation to reduce your risk of skin cancer. Safety Always wear your seat belt while driving or riding in a vehicle. Do not drive: If you have been drinking alcohol. Do not ride with someone who  has been drinking. When you are tired or distracted. While texting. If you have been using any mind-altering substances or drugs. Wear a helmet and other protective equipment during sports activities. If you have firearms in your house, make sure you follow all gun safety procedures. What's next? Go to your health care provider once a year for an annual wellness visit. Ask your health care provider how often you should have your eyes and teeth checked. Stay up to date on all vaccines. This information is not intended to replace advice given to you by your health care provider. Make sure you discuss any questions you have with your health care provider. Document Revised: 10/10/2020 Document Reviewed: 10/10/2020 Elsevier Patient Education  [place],   Merri Ray, MD [place] 03/07/22 9:19 AM

## 2022-03-07 NOTE — Patient Instructions (Signed)
Thanks for coming in today.  Keep up the good work with watching diet and weight loss.  I expect the A1c/prediabetes test to be improved.  Continue follow-up with specialist.  Please call your gastroenterologist to follow-up on the abnormal liver findings but I am also happy to place a referral if that is needed.  I will check electrolytes along with A1c today.  17-monthfollow-up for prediabetes and repeat labs, let me know if there are questions sooner and take care.   Preventive Care [id number] Old, Male Preventive care refers to lifestyle choices and visits with your health care provider that can promote health and wellness. Preventive care visits are also called wellness exams. What can I expect for my preventive care visit? Counseling During your preventive care visit, your health care provider may ask about your: Medical history, including: Past medical problems. Family medical history. Current health, including: Emotional well-being. Home life and relationship well-being. Sexual activity. Lifestyle, including: Alcohol, nicotine or tobacco, and drug use. Access to firearms. Diet, exercise, and sleep habits. Safety issues such as seatbelt and bike helmet use. Sunscreen use. Work and work eStatistician Physical exam Your health care provider will check your: Height and weight. These may be used to calculate your BMI (body mass index). BMI is a measurement that tells if you are at a healthy weight. Waist circumference. This measures the distance around your waistline. This measurement also tells if you are at a healthy weight and may help predict your risk of certain diseases, such as type 2 diabetes and high blood pressure. Heart rate and blood pressure. Body temperature. Skin for abnormal spots. What immunizations do I need?  Vaccines are usually given at various ages, according to a schedule. Your health care provider will recommend vaccines for you based on your age, medical  history, and lifestyle or other factors, such as travel or where you work. What tests do I need? Screening Your health care provider may recommend screening tests for certain conditions. This may include: Lipid and cholesterol levels. Diabetes screening. This is done by checking your blood sugar (glucose) after you have not eaten for a while (fasting). Hepatitis B test. Hepatitis C test. HIV (human immunodeficiency virus) test. STI (sexually transmitted infection) testing, if you are at risk. Lung cancer screening. Prostate cancer screening. Colorectal cancer screening. Talk with your health care provider about your test results, treatment options, and if necessary, the need for more tests. Follow these instructions at home: Eating and drinking  Eat a diet that includes fresh fruits and vegetables, whole grains, lean protein, and low-fat dairy products. Take vitamin and mineral supplements as recommended by your health care provider. Do not drink alcohol if your health care provider tells you not to drink. If you drink alcohol: Limit how much you have to 0-2 drinks a day. Know how much alcohol is in your drink. In the [place] one drink equals one 12 oz bottle of beer (355 mL), one 5 oz glass of wine (148 mL), or one 1 oz glass of hard liquor (44 mL). Lifestyle Brush your teeth every morning and night with fluoride toothpaste. Floss one time each day. Exercise for at least 30 minutes 5 or more days each week. Do not use any products that contain nicotine or tobacco. These products include cigarettes, chewing tobacco, and vaping devices, such as e-cigarettes. If you need help quitting, ask your health care provider. Do not use drugs. If you are sexually active, practice safe sex. Use a condom  or other form of protection to prevent STIs. Take aspirin only as told by your health care provider. Make sure that you understand how much to take and what form to take. Work with your health care  provider to find out whether it is safe and beneficial for you to take aspirin daily. Find healthy ways to manage stress, such as: Meditation, yoga, or listening to music. Journaling. Talking to a trusted person. Spending time with friends and family. Minimize exposure to UV radiation to reduce your risk of skin cancer. Safety Always wear your seat belt while driving or riding in a vehicle. Do not drive: If you have been drinking alcohol. Do not ride with someone who has been drinking. When you are tired or distracted. While texting. If you have been using any mind-altering substances or drugs. Wear a helmet and other protective equipment during sports activities. If you have firearms in your house, make sure you follow all gun safety procedures. What's next? Go to your health care provider once a year for an annual wellness visit. Ask your health care provider how often you should have your eyes and teeth checked. Stay up to date on all vaccines. This information is not intended to replace advice given to you by your health care provider. Make sure you discuss any questions you have with your health care provider. Document Revised: 10/10/2020 Document Reviewed: 10/10/2020 Elsevier Patient Education  [place].

## 2022-03-10 ENCOUNTER — Telehealth: Payer: Self-pay | Admitting: Family Medicine

## 2022-03-10 NOTE — Telephone Encounter (Signed)
Printed and faxed per request, called patient and informed he was grateful to [place] working quickly

## 2022-03-10 NOTE — Telephone Encounter (Signed)
Caller name: Maxemiliano Riel  On DPR?: Yes  Call back number: [phone] (mobile)  Provider they see: Wendie Agreste, MD  Reason for call: Pt called stating that Dr. Alessandra Bevels  needs a copy of pt ultrasound of his liver. Please fax ultrasound to [phone].

## 2022-03-12 ENCOUNTER — Other Ambulatory Visit: Payer: Self-pay | Admitting: Gastroenterology

## 2022-03-12 DIAGNOSIS — R7303 Prediabetes: Secondary | ICD-10-CM | POA: Diagnosis not present

## 2022-03-12 DIAGNOSIS — K7689 Other specified diseases of liver: Secondary | ICD-10-CM

## 2022-03-16 ENCOUNTER — Other Ambulatory Visit: Payer: Self-pay | Admitting: Family Medicine

## 2022-03-16 DIAGNOSIS — E875 Hyperkalemia: Secondary | ICD-10-CM

## 2022-03-16 NOTE — Progress Notes (Signed)
See labs 

## 2022-04-06 ENCOUNTER — Ambulatory Visit
Admission: RE | Admit: 2022-04-06 | Discharge: 2022-04-06 | Disposition: A | Discharge status: Home / Self Care | Payer: BC Managed Care – PPO | Source: Ambulatory Visit | Attending: Gastroenterology | Admitting: Gastroenterology

## 2022-04-06 DIAGNOSIS — K7689 Other specified diseases of liver: Secondary | ICD-10-CM

## 2022-04-06 MED ORDER — GADOPICLENOL 0.5 MMOL/ML IV SOLN
8.0000 mL | Freq: Once | INTRAVENOUS | Status: AC | PRN
  Administered 2022-04-06: 8 mL via INTRAVENOUS

## 2022-04-14 DIAGNOSIS — K5150 Left sided colitis without complications: Secondary | ICD-10-CM | POA: Diagnosis not present

## 2022-04-14 DIAGNOSIS — Z8679 Personal history of other diseases of the circulatory system: Secondary | ICD-10-CM | POA: Diagnosis not present

## 2022-04-14 DIAGNOSIS — E559 Vitamin D deficiency, unspecified: Secondary | ICD-10-CM | POA: Diagnosis not present

## 2022-06-18 ENCOUNTER — Other Ambulatory Visit: Payer: Self-pay | Admitting: Interventional Cardiology

## 2022-07-11 DIAGNOSIS — R350 Frequency of micturition: Secondary | ICD-10-CM | POA: Diagnosis not present

## 2022-07-11 DIAGNOSIS — N401 Enlarged prostate with lower urinary tract symptoms: Secondary | ICD-10-CM | POA: Diagnosis not present

## 2022-07-18 DIAGNOSIS — N401 Enlarged prostate with lower urinary tract symptoms: Secondary | ICD-10-CM | POA: Diagnosis not present

## 2022-07-18 DIAGNOSIS — R972 Elevated prostate specific antigen [PSA]: Secondary | ICD-10-CM | POA: Diagnosis not present

## 2022-07-18 DIAGNOSIS — R350 Frequency of micturition: Secondary | ICD-10-CM | POA: Diagnosis not present

## 2022-08-20 DIAGNOSIS — R7303 Prediabetes: Secondary | ICD-10-CM | POA: Diagnosis not present

## 2022-09-03 DIAGNOSIS — R7303 Prediabetes: Secondary | ICD-10-CM | POA: Diagnosis not present

## 2022-09-05 ENCOUNTER — Ambulatory Visit: Payer: BC Managed Care – PPO | Admitting: Family Medicine

## 2022-10-15 DIAGNOSIS — R972 Elevated prostate specific antigen [PSA]: Secondary | ICD-10-CM | POA: Diagnosis not present

## 2022-11-09 ENCOUNTER — Other Ambulatory Visit: Payer: Self-pay | Admitting: Interventional Cardiology

## 2022-12-08 ENCOUNTER — Other Ambulatory Visit: Payer: Self-pay | Admitting: Interventional Cardiology

## 2022-12-19 ENCOUNTER — Other Ambulatory Visit: Payer: Self-pay | Admitting: Interventional Cardiology

## 2022-12-19 ENCOUNTER — Other Ambulatory Visit: Payer: Self-pay

## 2022-12-19 MED ORDER — CLOPIDOGREL BISULFATE 75 MG PO TABS: 75.0000 mg | ORAL_TABLET | Freq: Every day | ORAL | 1 refills | Status: DC

## 2022-12-25 ENCOUNTER — Other Ambulatory Visit: Payer: Self-pay | Admitting: Interventional Cardiology

## 2023-01-01 ENCOUNTER — Ambulatory Visit: Payer: BC Managed Care – PPO | Admitting: Nurse Practitioner

## 2023-01-04 NOTE — Progress Notes (Unsigned)
Cardiology Office Note    Patient Name: Dale Meadows Date of Encounter: 01/04/2023  Primary Care Provider:  Shade Flood, MD Primary Cardiologist:  Lance Muss, MD Primary Electrophysiologist: None   Past Medical History    Past Medical History:  Diagnosis Date   Allergy    CAD (coronary artery disease)    a. s/p inferior STEMI (07/2013)   Cancer (HCC) 05/29/2013   prostate   Hematuria    HLD (hyperlipidemia)    Junctional bradycardia    Myocardial infarction (HCC)    Seizures (HCC)    last seizure 26 years ago   Ulcerative colitis (HCC)    Ulcerative colitis (HCC)     History of Present Illness  Dale Meadows is a 62 y.o. male with a PMH of CAD s/p inferior STEMI 07/2013 s/p thrombectomy/PTCA of RCA/PDA, ICM, chronic combined CHF, HLD, prostate CA,ulcerative colitis who presents today for follow-up.  Mr. Shambaugh was seen initially in 2015 after suffering an inferior STEMI due to thrombotic occluded distal RCA treated with angioplasty with left-to-right collaterals feeding the distal RCA territory.  2D echo was completed showing reduced EF of 40-45% with mild dilated aortic root.  He was intolerant to ACE/ARB and beta-blocker due to hypotension.  Most recent TTE was completed 08/2021 with mildly decreased LV function of 40-45% with mild LVH and grade 1 DD with no significant valvular abnormalities.  He was last seen in the office by Dr. Eldridge Dace on 10/2021 and had resumed walking after suffering torn the cartilage.  He had no complaints of shortness of breath or chest pain at that time.  He was continued on aggressive secondary prevention and Plavix.  He underwent a colonoscopy last year for UC.  During today's visit the patient reports that he is doing well with no new cardiac complaints since his previous follow-up.  His blood pressure today is well-controlled at 126/72 and heart rate was 55 bpm.  He reports some myalgias and congestion with Lipitor and would like to  discontinue today.  We discussed the importance of primary and secondary prevention regarding coronary artery disease.  Through a shared decision patient will discontinue atorvastatin today and focus on lifestyle modification.  We will check his lipids today and then again in 6 months.  He reports compliance with his other medications and denies any adverse reactions.  He is staying active and participates in pickleball and basketball as well as lifts weights.  He also reports not experiencing any presyncope episodes since his follow-up visit.  Denies chest pain, palpitations, dyspnea, PND, orthopnea, nausea, vomiting, dizziness, syncope, edema, weight gain, or early satiety.   Review of Systems  Please see the history of present illness.    All other systems reviewed and are otherwise negative except as noted above.  Physical Exam    Wt Readings from Last 3 Encounters:  03/07/22 185 lb 6.4 oz (84.1 kg)  02/06/22 191 lb 12.8 oz (87 kg)  01/23/22 189 lb (85.7 kg)   GH:WEXHB were no vitals filed for this visit.,There is no height or weight on file to calculate BMI. GEN: Well nourished, well developed in no acute distress Neck: No JVD; No carotid bruits Pulmonary: Clear to auscultation without rales, wheezing or rhonchi  Cardiovascular: Normal rate. Regular rhythm. Normal S1. Normal S2.   Murmurs: There is no murmur.  ABDOMEN: Soft, non-tender, non-distended EXTREMITIES:  No edema; No deformity   EKG/LABS/ Recent Cardiac Studies   ECG personally reviewed by me today -sinus bradycardia with  first-degree AVB and rate of 55 bpm with no acute changes consistent with previous EKG.  Risk Assessment/Calculations:          Lab Results  Component Value Date   WBC 6.3 10/30/2021   HGB 16.0 10/30/2021   HCT 46.3 10/30/2021   MCV 87 10/30/2021   PLT 311 10/30/2021   Lab Results  Component Value Date   CREATININE 0.86 03/07/2022   BUN 17 03/07/2022   NA 138 03/07/2022   K 5.2 No hemolysis  seen (H) 03/07/2022   CL 104 03/07/2022   CO2 28 03/07/2022   Lab Results  Component Value Date   CHOL 154 10/30/2021   HDL 42 10/30/2021   LDLCALC 89 10/30/2021   TRIG 127 10/30/2021   CHOLHDL 3.7 10/30/2021    Lab Results  Component Value Date   HGBA1C 5.7 03/07/2022   Assessment & Plan    1.  Coronary artery disease: -s/p inferior MI 2015 with aneurysmal RCA with left-to-right collaterals filling the distal RCA territory -Today patient reports episodes of angina or shortness of breath since a follow-up visit. -We discussed the importance of primary and secondary prevention and patient will discontinue atorvastatin and focus on lifestyle modifications. -Continue GDMT with Plavix 75 mg, as needed Nitrostat 0.4 mg  2.  Chronic combined CHF: -Patient underwent TTE on 08/2021 showing unchanged EF of 40-45% unchanged from previous echo -Today patient is euvolemic on examination and denies any shortness of breath with activities. -Will plan to repeat 2D echo at 1 year follow-up. -Low sodium diet, fluid restriction <2L, and daily weights encouraged. Educated to contact our office for weight gain of 2 lbs overnight or 5 lbs in one week.   3.  Hyperlipidemia: -Patient's last LDL cholesterol was 89 -Patient will discontinue atorvastatin 40 mg today and focus on lifestyle modifications. -We will check LFTs and lipids today and again in 6 months. -He is in favor of restarting statin therapy if lipids are elevated at recheck in 6 months.  4.  Aortic root dilation: -2D echo completed recently showing no evidence of ascending aortic dilation.  Disposition: Follow-up with Lance Muss, MD or APP in 6 months    Signed, Napoleon Form, Leodis Rains, NP 01/04/2023, 3:52 PM [facility]

## 2023-01-05 ENCOUNTER — Ambulatory Visit: Payer: BC Managed Care – PPO | Attending: Nurse Practitioner | Admitting: Nurse Practitioner

## 2023-01-05 ENCOUNTER — Encounter: Payer: Self-pay | Admitting: Nurse Practitioner

## 2023-01-05 VITALS — BP 126/72 | HR 55 | Ht 72.5 in | Wt 180.6 lb

## 2023-01-05 DIAGNOSIS — I2510 Atherosclerotic heart disease of native coronary artery without angina pectoris: Secondary | ICD-10-CM | POA: Diagnosis not present

## 2023-01-05 DIAGNOSIS — I77810 Thoracic aortic ectasia: Secondary | ICD-10-CM

## 2023-01-05 DIAGNOSIS — E782 Mixed hyperlipidemia: Secondary | ICD-10-CM | POA: Diagnosis not present

## 2023-01-05 DIAGNOSIS — I5042 Chronic combined systolic (congestive) and diastolic (congestive) heart failure: Secondary | ICD-10-CM

## 2023-01-05 MED ORDER — CLOPIDOGREL BISULFATE 75 MG PO TABS: 75.0000 mg | ORAL_TABLET | Freq: Every day | ORAL | 1 refills | Status: DC

## 2023-01-05 NOTE — Patient Instructions (Signed)
Medication Instructions:  STOP Lipitor Get some Quinol (coQ10) as discussed during your visit *If you need a refill on your cardiac medications before your next appointment, please call your pharmacy*   Lab Work: TODAY-LIPIDS & LFT'S  6-7 MONTHS FASTING LIPIDS & LFT"s If you have labs (blood work) drawn today and your tests are completely normal, you will receive your results only by: MyChart Message (if you have MyChart) OR A paper copy in the mail If you have any lab test that is abnormal or we need to change your treatment, we will call you to review the results.   Testing/Procedures: NONE ORDERED   Follow-Up: At [facility], you and your health needs are our priority.  As part of our continuing mission to provide you with exceptional heart care, we have created designated Provider Care Teams.  These Care Teams include your primary Cardiologist (physician) and Advanced Practice Providers (APPs -  Physician Assistants and Nurse Practitioners) who all work together to provide you with the care you need, when you need it.  We recommend signing up for the patient portal called "MyChart".  Sign up information is provided on this After Visit Summary.  MyChart is used to connect with patients for Virtual Visits (Telemedicine).  Patients are able to view lab/test results, encounter notes, upcoming appointments, etc.  Non-urgent messages can be sent to your provider as well.   To learn more about what you can do with MyChart, go to [email or web address].    Your next appointment:   6 month(s)  Provider:   Donato Schultz, MD Lennie Odor, MD   Other Instructions

## 2023-01-06 LAB — LIPID PANEL
Chol/HDL Ratio: 4.2 ratio (ref 0.0–5.0)
Cholesterol, Total: 189 mg/dL (ref 100–199)
HDL: 45 mg/dL (ref >39)
LDL Chol Calc (NIH): 116 mg/dL — ABNORMAL HIGH (ref 0–99)
Triglycerides: 157 mg/dL — ABNORMAL HIGH (ref 0–149)
VLDL Cholesterol Cal: 28 mg/dL (ref 5–40)

## 2023-01-06 LAB — HEPATIC FUNCTION PANEL
ALT: 32 IU/L (ref 0–44)
AST: 25 IU/L (ref 0–40)
Albumin: 4.5 g/dL (ref 3.9–4.9)
Alkaline Phosphatase: 95 IU/L (ref 44–121)
Bilirubin Total: 0.5 mg/dL (ref 0.0–1.2)
Bilirubin, Direct: 0.16 mg/dL (ref 0.00–0.40)
Total Protein: 7.4 g/dL (ref 6.0–8.5)

## 2023-01-14 DIAGNOSIS — R972 Elevated prostate specific antigen [PSA]: Secondary | ICD-10-CM | POA: Diagnosis not present

## 2023-01-31 ENCOUNTER — Other Ambulatory Visit: Payer: Self-pay | Admitting: Interventional Cardiology

## 2023-03-09 ENCOUNTER — Ambulatory Visit: Payer: BC Managed Care – PPO | Admitting: Family Medicine

## 2023-03-09 ENCOUNTER — Encounter: Payer: Self-pay | Admitting: Family Medicine

## 2023-03-09 VITALS — BP 126/70 | HR 66 | Temp 98.0°F | Ht 72.5 in | Wt 176.0 lb

## 2023-03-09 DIAGNOSIS — Z0000 Encounter for general adult medical examination without abnormal findings: Secondary | ICD-10-CM

## 2023-03-09 DIAGNOSIS — R7303 Prediabetes: Secondary | ICD-10-CM | POA: Diagnosis not present

## 2023-03-09 DIAGNOSIS — E782 Mixed hyperlipidemia: Secondary | ICD-10-CM

## 2023-03-09 DIAGNOSIS — Z23 Encounter for immunization: Secondary | ICD-10-CM | POA: Diagnosis not present

## 2023-03-09 LAB — POCT GLYCOSYLATED HEMOGLOBIN (HGB A1C): Hemoglobin A1C: 5 % (ref 4.0–5.6)

## 2023-03-09 NOTE — Progress Notes (Signed)
Subjective:  Patient ID: Dale Meadows, male    DOB: 1960/10/11  Age: 62 y.o. MRN: [id number]  CC:  Chief Complaint  Patient presents with   Annual Exam    No health changes. Off statin - see below.   HPI Dale Meadows presents for Annual Exam PCP, me Urology, Dr. Laverle Patter with history of elevated PSA.  Yearly urologic visits.suspicious for CA in past with elevated PSA. Last check with improvements - annual PSA. Last visit in the spring.  Cardiology, Dr. Eldridge Dace, history of CAD, appointment with cardiology September 9th.  STEMI in 2015, thrombectomy, PTCA of RCA/PDA, ICM, chronic combined CHF, hyperlipidemia.  Intolerant to ACE inhibitor and beta-blocker due to hypotension.  EF 40 to 45% with echo in May 2023.  Doing well at cardiology visit, pickleball, basketball, weightlifting without any cardiac symptoms.  Continued on Plavix.  Statin was discontinued due to myalgias.  Plan for echo at follow-up in 1 year.  Plan for repeat lipids in 6 months, with plan to restart statin therapy if elevated at that time.  Prior aortic root dilatation but no evidence of ascending aortic dilation on echo. Gastroenterology, Dr. Levora Angel, ulcerative colitis treated with Lialda - still taking, stable. Yearly visits.    Lab Results  Component Value Date   ALT 32 01/05/2023   AST 25 01/05/2023   ALKPHOS 95 01/05/2023   BILITOT 0.5 01/05/2023   Lab Results  Component Value Date   CHOL 189 01/05/2023   HDL 45 01/05/2023   LDLCALC 116 (H) 01/05/2023   TRIG 157 (H) 01/05/2023   CHOLHDL 4.2 01/05/2023       03/09/2023    8:36 AM 03/07/2022    8:38 AM 02/06/2022    1:38 PM 01/23/2022    1:43 PM 09/02/2021   11:59 AM  Depression screen PHQ 2/9  Decreased Interest 0 0 0 0 0  Down, Depressed, Hopeless 0 0 0 0 0  PHQ - 2 Score 0 0 0 0 0  Altered sleeping 0 0 0 0 0  Tired, decreased energy 1 0 1 3 0  Change in appetite 0 0 0 0 0  Feeling bad or failure about yourself  0 0 0 0 0  Trouble  concentrating 0 0 0 0 0  Moving slowly or fidgety/restless 0 0 0 0 0  Suicidal thoughts 0 0 0 0 0  PHQ-9 Score 1 0 1 3 0  Difficult doing work/chores    Somewhat difficult Not difficult at all    Health Maintenance  Topic Date Due   HIV Screening  Never done   Hepatitis C Screening  Never done   INFLUENZA VACCINE  11/27/2022   COVID-19 Vaccine (4 - 2023-24 season) 12/28/2022   Colonoscopy  05/09/2027   DTaP/Tdap/Td (2 - Td or Tdap) 10/25/2030   Zoster Vaccines- Shingrix  Completed   HPV VACCINES  Aged Out  GI - regular colonoscopy with UC. Last fall.  Prostate: followed by urology as above.   Immunization History  Administered Date(s) Administered   Influenza,inj,Quad PF,6+ Mos 02/08/2014, 01/23/2022   PFIZER(Purple Top)SARS-COV-2 Vaccination 07/15/2019, 08/05/2019, 04/10/2020   Tdap 10/24/2020   Zoster Recombinant(Shingrix) 05/22/2020, 07/19/2020  Flu vaccine today.  Covid booster - declines.  RSV vaccine - declines.   No results found. Optho yearly. - due. Plans to schedule  Dental:1-2x/yr - appt in June/July.   Alcohol: none  Tobacco: none  Exercise: pickelball, basketball, weightlifting prior. Bruised rib, then shoulder injury - recovering, slowed down on exercise  x 6 weeks, plans to increase again.  Lost 10# with [employer] program, borderline A1c in past. Requests today.  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 11/04/2021   Pain of left heel 10/01/2021   Acute pain of left knee 09/04/2021   Fainting spell 09/01/2021   Old MI (myocardial infarction) 01/03/2015   CAD (coronary artery disease)    Chest pain 02/07/2014    Speech abnormality 12/29/2013   Acute myocardial infarction, initial episode of care (HCC) 08/10/2013   Prostate cancer (HCC) 08/01/2013   Seizures (HCC)    Hematuria    Junctional bradycardia    STEMI (ST elevation myocardial infarction), inferior 07/27/2013   Ulcerative colitis (HCC) 07/27/2013   HLD (hyperlipidemia) 07/27/2013   Acute myocardial infarction of other inferior wall, initial episode of care 07/27/2013   Cancer (HCC) 05/29/2013   Past Medical History:  Diagnosis Date   Allergy    CAD (coronary artery disease)    a. s/p inferior STEMI (07/2013)   Cancer (HCC) 05/29/2013   prostate   Hematuria    HLD (hyperlipidemia)    Junctional bradycardia    Myocardial infarction (HCC)    Seizures (HCC)    last seizure 26 years ago   Ulcerative colitis (HCC)    Ulcerative colitis (HCC)    Past Surgical History:  Procedure Laterality Date   ANTERIOR CRUCIATE LIGAMENT REPAIR Right 1990   BIOPSY PROSTATE     LEFT HEART CATHETERIZATION WITH CORONARY ANGIOGRAM N/A 07/27/2013   Procedure: LEFT HEART CATHETERIZATION WITH CORONARY ANGIOGRAM;  Surgeon: Corky Crafts, MD;  Location: [facility] CATH LAB;  Service: Cardiovascular;  Laterality: N/A;   LEFT HEART CATHETERIZATION WITH CORONARY ANGIOGRAM N/A 07/29/2013   Procedure: LEFT HEART CATHETERIZATION WITH CORONARY ANGIOGRAM;  Surgeon: Lesleigh Noe, MD;  Location: [facility] CATH LAB;  Service: Cardiovascular;  Laterality: N/A;   No Known Allergies Prior to Admission medications   Medication Sig Start Date End Date Taking? Authorizing Provider  Cholecalciferol (VITAMIN D3) 10 MCG (400 UNIT) CAPS Take by mouth.    [provider]  clopidogrel (PLAVIX) 75 MG tablet Take 1 tablet (75 mg total) by mouth daily. 01/05/23   Gaston Islam., NP  LIALDA 1.2 G EC tablet Take 1.2 g by mouth daily with breakfast.  10/05/14   [provider]  nitroGLYCERIN (NITROSTAT) 0.4 MG SL tablet DISSOLVE 1 TABLET UNDER THE TONGUE EVERY 5 MINUTES  AS  NEEDED FOR CHEST PAIN,NOT  TO EXCEED 3 DOSES IN 15 MIN IF PAIN PERSISTS, CALL 911 07/21/19   Corky Crafts, MD  vitamin B-12 (CYANOCOBALAMIN) 1000 MCG tablet Take 1,000 mcg by mouth daily.    [provider]   Social History   Socioeconomic History   Marital status: Married    Spouse name: Not on file   Number of children: Not on file   Years of education: Not on file   Highest education level: Not on file  Occupational History   Not on file  Tobacco Use   Smoking status: Former    Types: Cigars  Quit date: 07/27/2008    Years since quitting: 14.6   Smokeless tobacco: Never  Vaping Use   Vaping status: Never Used  Substance and Sexual Activity   Alcohol use: Yes    Comment: occassional beer or wine, < 1 permonth   Drug use: Never   Sexual activity: Yes  Other Topics Concern   Not on file  Social History Narrative   Not on file   Social Determinants of Health   Financial Resource Strain: Not on file  Food Insecurity: Not on file  Transportation Needs: Not on file  Physical Activity: Not on file  Stress: Not on file  Social Connections: Not on file  Intimate Partner Violence: Not on file    Review of Systems  13 point review of systems per patient health survey noted.  Negative other than as indicated above or in HPI.   Objective:   Vitals:   03/09/23 0834  BP: 126/70  Pulse: 66  Temp: 98 F (36.7 C)  TempSrc: Temporal  SpO2: 96%  Weight: 176 lb (79.8 kg)  Height: 6' 0.5" (1.842 m)     Physical Exam Vitals reviewed.  Constitutional:      Appearance: He is well-developed.  HENT:     Head: Normocephalic and atraumatic.     Right Ear: External ear normal.     Left Ear: External ear normal.  Eyes:     Conjunctiva/sclera: Conjunctivae normal.     Pupils: Pupils are equal, round, and reactive to light.  Neck:     Thyroid: No thyromegaly.  Cardiovascular:     Rate and Rhythm: Normal rate and regular rhythm.     Heart sounds: Normal  heart sounds.  Pulmonary:     Effort: Pulmonary effort is normal. No respiratory distress.     Breath sounds: Normal breath sounds. No wheezing.  Abdominal:     General: There is no distension.     Palpations: Abdomen is soft.     Tenderness: There is no abdominal tenderness.  Musculoskeletal:        General: No tenderness. Normal range of motion.     Cervical back: Normal range of motion and neck supple.  Lymphadenopathy:     Cervical: No cervical adenopathy.  Skin:    General: Skin is warm and dry.  Neurological:     Mental Status: He is alert and oriented to person, place, and time.     Deep Tendon Reflexes: Reflexes are normal and symmetric.  Psychiatric:        Behavior: Behavior normal.        Assessment & Plan:  Caprice Drakos is a 62 y.o. male . Physical exam  Mixed hyperlipidemia  Prediabetes - Plan: POCT glycosylated hemoglobin (Hb A1C)  Flu vaccine need - Plan: Flu vaccine trivalent PF, 6mos and older(Flulaval,Afluria,Fluarix,Fluzone)  -anticipatory guidance as below in AVS, screening labs above. Health maintenance items as above in HPI discussed/recommended as applicable.  Check A1c with history of borderline prediabetes, keep follow-up with specialist regarding hyperlipidemia off statin.  Plans diet approach.   No orders of the defined types were placed in this encounter.  Patient Instructions  Thanks for coming in today.  Keep follow-up with cardiology regarding cholesterol need for additional medication.  Recheck with me in 1 year for physical.  Happy to see you sooner if any new concerns or questions.  Take care.   Preventive Care 58-64 Years Old, Male Preventive care refers to lifestyle choices and visits with your health  care provider that can promote health and wellness. Preventive care visits are also called wellness exams. What can I expect for my preventive care visit? Counseling During your preventive care visit, your health care provider may  ask about your: Medical history, including: Past medical problems. Family medical history. Current health, including: Emotional well-being. Home life and relationship well-being. Sexual activity. Lifestyle, including: Alcohol, nicotine or tobacco, and drug use. Access to firearms. Diet, exercise, and sleep habits. Safety issues such as seatbelt and bike helmet use. Sunscreen use. Work and work Astronomer. Physical exam Your health care provider will check your: Height and weight. These may be used to calculate your BMI (body mass index). BMI is a measurement that tells if you are at a healthy weight. Waist circumference. This measures the distance around your waistline. This measurement also tells if you are at a healthy weight and may help predict your risk of certain diseases, such as type 2 diabetes and high blood pressure. Heart rate and blood pressure. Body temperature. Skin for abnormal spots. What immunizations do I need?  Vaccines are usually given at various ages, according to a schedule. Your health care provider will recommend vaccines for you based on your age, medical history, and lifestyle or other factors, such as travel or where you work. What tests do I need? Screening Your health care provider may recommend screening tests for certain conditions. This may include: Lipid and cholesterol levels. Diabetes screening. This is done by checking your blood sugar (glucose) after you have not eaten for a while (fasting). Hepatitis B test. Hepatitis C test. HIV (human immunodeficiency virus) test. STI (sexually transmitted infection) testing, if you are at risk. Lung cancer screening. Prostate cancer screening. Colorectal cancer screening. Talk with your health care provider about your test results, treatment options, and if necessary, the need for more tests. Follow these instructions at home: Eating and drinking  Eat a diet that includes fresh fruits and vegetables,  whole grains, lean protein, and low-fat dairy products. Take vitamin and mineral supplements as recommended by your health care provider. Do not drink alcohol if your health care provider tells you not to drink. If you drink alcohol: Limit how much you have to 0-2 drinks a day. Know how much alcohol is in your drink. In the [place] one drink equals one 12 oz bottle of beer (355 mL), one 5 oz glass of wine (148 mL), or one 1 oz glass of hard liquor (44 mL). Lifestyle Brush your teeth every morning and night with fluoride toothpaste. Floss one time each day. Exercise for at least 30 minutes 5 or more days each week. Do not use any products that contain nicotine or tobacco. These products include cigarettes, chewing tobacco, and vaping devices, such as e-cigarettes. If you need help quitting, ask your health care provider. Do not use drugs. If you are sexually active, practice safe sex. Use a condom or other form of protection to prevent STIs. Take aspirin only as told by your health care provider. Make sure that you understand how much to take and what form to take. Work with your health care provider to find out whether it is safe and beneficial for you to take aspirin daily. Find healthy ways to manage stress, such as: Meditation, yoga, or listening to music. Journaling. Talking to a trusted person. Spending time with friends and family. Minimize exposure to UV radiation to reduce your risk of skin cancer. Safety Always wear your seat belt while driving or riding in  a vehicle. Do not drive: If you have been drinking alcohol. Do not ride with someone who has been drinking. When you are tired or distracted. While texting. If you have been using any mind-altering substances or drugs. Wear a helmet and other protective equipment during sports activities. If you have firearms in your house, make sure you follow all gun safety procedures. What's next? Go to your health care provider once a year  for an annual wellness visit. Ask your health care provider how often you should have your eyes and teeth checked. Stay up to date on all vaccines. This information is not intended to replace advice given to you by your health care provider. Make sure you discuss any questions you have with your health care provider. Document Revised: 10/10/2020 Document Reviewed: 10/10/2020 Elsevier Patient Education  2024 [employer] Inc.     Signed,   Meredith Staggers, MD [facility], [facility] 03/09/23 9:22 AM

## 2023-03-09 NOTE — Patient Instructions (Signed)
Thanks for coming in today.  Keep follow-up with cardiology regarding cholesterol need for additional medication.  Recheck with me in 1 year for physical.  Happy to see you sooner if any new concerns or questions.  Take care.   Preventive Care 67-64 Years Old, Male Preventive care refers to lifestyle choices and visits with your health care provider that can promote health and wellness. Preventive care visits are also called wellness exams. What can I expect for my preventive care visit? Counseling During your preventive care visit, your health care provider may ask about your: Medical history, including: Past medical problems. Family medical history. Current health, including: Emotional well-being. Home life and relationship well-being. Sexual activity. Lifestyle, including: Alcohol, nicotine or tobacco, and drug use. Access to firearms. Diet, exercise, and sleep habits. Safety issues such as seatbelt and bike helmet use. Sunscreen use. Work and work Astronomer. Physical exam Your health care provider will check your: Height and weight. These may be used to calculate your BMI (body mass index). BMI is a measurement that tells if you are at a healthy weight. Waist circumference. This measures the distance around your waistline. This measurement also tells if you are at a healthy weight and may help predict your risk of certain diseases, such as type 2 diabetes and high blood pressure. Heart rate and blood pressure. Body temperature. Skin for abnormal spots. What immunizations do I need?  Vaccines are usually given at various ages, according to a schedule. Your health care provider will recommend vaccines for you based on your age, medical history, and lifestyle or other factors, such as travel or where you work. What tests do I need? Screening Your health care provider may recommend screening tests for certain conditions. This may include: Lipid and cholesterol levels. Diabetes  screening. This is done by checking your blood sugar (glucose) after you have not eaten for a while (fasting). Hepatitis B test. Hepatitis C test. HIV (human immunodeficiency virus) test. STI (sexually transmitted infection) testing, if you are at risk. Lung cancer screening. Prostate cancer screening. Colorectal cancer screening. Talk with your health care provider about your test results, treatment options, and if necessary, the need for more tests. Follow these instructions at home: Eating and drinking  Eat a diet that includes fresh fruits and vegetables, whole grains, lean protein, and low-fat dairy products. Take vitamin and mineral supplements as recommended by your health care provider. Do not drink alcohol if your health care provider tells you not to drink. If you drink alcohol: Limit how much you have to 0-2 drinks a day. Know how much alcohol is in your drink. In the [place] one drink equals one 12 oz bottle of beer (355 mL), one 5 oz glass of wine (148 mL), or one 1 oz glass of hard liquor (44 mL). Lifestyle Brush your teeth every morning and night with fluoride toothpaste. Floss one time each day. Exercise for at least 30 minutes 5 or more days each week. Do not use any products that contain nicotine or tobacco. These products include cigarettes, chewing tobacco, and vaping devices, such as e-cigarettes. If you need help quitting, ask your health care provider. Do not use drugs. If you are sexually active, practice safe sex. Use a condom or other form of protection to prevent STIs. Take aspirin only as told by your health care provider. Make sure that you understand how much to take and what form to take. Work with your health care provider to find out whether it is  safe and beneficial for you to take aspirin daily. Find healthy ways to manage stress, such as: Meditation, yoga, or listening to music. Journaling. Talking to a trusted person. Spending time with friends and  family. Minimize exposure to UV radiation to reduce your risk of skin cancer. Safety Always wear your seat belt while driving or riding in a vehicle. Do not drive: If you have been drinking alcohol. Do not ride with someone who has been drinking. When you are tired or distracted. While texting. If you have been using any mind-altering substances or drugs. Wear a helmet and other protective equipment during sports activities. If you have firearms in your house, make sure you follow all gun safety procedures. What's next? Go to your health care provider once a year for an annual wellness visit. Ask your health care provider how often you should have your eyes and teeth checked. Stay up to date on all vaccines. This information is not intended to replace advice given to you by your health care provider. Make sure you discuss any questions you have with your health care provider. Document Revised: 10/10/2020 Document Reviewed: 10/10/2020 Elsevier Patient Education  2024 [employer].

## 2023-06-29 DIAGNOSIS — E559 Vitamin D deficiency, unspecified: Secondary | ICD-10-CM | POA: Diagnosis not present

## 2023-06-29 DIAGNOSIS — Z8679 Personal history of other diseases of the circulatory system: Secondary | ICD-10-CM | POA: Diagnosis not present

## 2023-06-29 DIAGNOSIS — K5150 Left sided colitis without complications: Secondary | ICD-10-CM | POA: Diagnosis not present

## 2023-06-30 ENCOUNTER — Other Ambulatory Visit: Payer: Self-pay | Admitting: Nurse Practitioner

## 2023-07-06 ENCOUNTER — Other Ambulatory Visit: Payer: BC Managed Care – PPO

## 2023-07-06 ENCOUNTER — Other Ambulatory Visit: Payer: Self-pay

## 2023-07-06 DIAGNOSIS — I2510 Atherosclerotic heart disease of native coronary artery without angina pectoris: Secondary | ICD-10-CM | POA: Diagnosis not present

## 2023-07-06 DIAGNOSIS — I5042 Chronic combined systolic (congestive) and diastolic (congestive) heart failure: Secondary | ICD-10-CM

## 2023-07-06 DIAGNOSIS — E782 Mixed hyperlipidemia: Secondary | ICD-10-CM | POA: Diagnosis not present

## 2023-07-06 LAB — HEPATIC FUNCTION PANEL
ALT: 21 IU/L (ref 0–44)
AST: 22 IU/L (ref 0–40)
Albumin: 4.5 g/dL (ref 3.9–4.9)
Alkaline Phosphatase: 80 IU/L (ref 44–121)
Bilirubin Total: 0.5 mg/dL (ref 0.0–1.2)
Bilirubin, Direct: 0.13 mg/dL (ref 0.00–0.40)
Total Protein: 7.2 g/dL (ref 6.0–8.5)

## 2023-07-06 LAB — LIPID PANEL
Chol/HDL Ratio: 5.9 ratio — ABNORMAL HIGH (ref 0.0–5.0)
Cholesterol, Total: 276 mg/dL — ABNORMAL HIGH (ref 100–199)
HDL: 47 mg/dL (ref >39)
LDL Chol Calc (NIH): 201 mg/dL — ABNORMAL HIGH (ref 0–99)
Triglycerides: 151 mg/dL — ABNORMAL HIGH (ref 0–149)
VLDL Cholesterol Cal: 28 mg/dL (ref 5–40)

## 2023-07-10 DIAGNOSIS — R972 Elevated prostate specific antigen [PSA]: Secondary | ICD-10-CM | POA: Diagnosis not present

## 2023-07-13 ENCOUNTER — Ambulatory Visit: Payer: BC Managed Care – PPO | Attending: Cardiology | Admitting: Cardiology

## 2023-07-13 ENCOUNTER — Encounter: Payer: Self-pay | Admitting: Cardiology

## 2023-07-13 VITALS — BP 104/72 | HR 73 | Ht 72.0 in | Wt 186.8 lb

## 2023-07-13 DIAGNOSIS — I2510 Atherosclerotic heart disease of native coronary artery without angina pectoris: Secondary | ICD-10-CM

## 2023-07-13 DIAGNOSIS — Z789 Other specified health status: Secondary | ICD-10-CM | POA: Diagnosis not present

## 2023-07-13 DIAGNOSIS — I5042 Chronic combined systolic (congestive) and diastolic (congestive) heart failure: Secondary | ICD-10-CM

## 2023-07-13 NOTE — Patient Instructions (Signed)
 Medication Instructions:  The current medical regimen is effective;  continue present plan and medications.  *If you need a refill on your cardiac medications before your next appointment, please call your pharmacy*  You have been referred to Lipid Clinic for further evaluation and treatment of hyperlipidemia.   Follow-Up: At [facility], you and your health needs are our priority.  As part of our continuing mission to provide you with exceptional heart care, we have created designated Provider Care Teams.  These Care Teams include your primary Cardiologist (physician) and Advanced Practice Providers (APPs -  Physician Assistants and Nurse Practitioners) who all work together to provide you with the care you need, when you need it.  We recommend signing up for the patient portal called "MyChart".  Sign up information is provided on this After Visit Summary.  MyChart is used to connect with patients for Virtual Visits (Telemedicine).  Patients are able to view lab/test results, encounter notes, upcoming appointments, etc.  Non-urgent messages can be sent to your provider as well.   To learn more about what you can do with MyChart, go to [email or web address].    Your next appointment:   1 year(s)  Provider:   Dr Donato Schultz

## 2023-07-13 NOTE — Progress Notes (Signed)
 Cardiology Office Note:  .   Date:  07/13/2023  ID:  Dale Meadows, DOB June 09, 1960, MRN [id number] PCP: Shade Flood, MD  [facility] Providers Cardiologist:  Donato Schultz, MD     History of Present Illness: .   Dale Meadows is a 63 y.o. male Discussed the use of AI scribe software for clinical note transcription with the patient, who gave verbal consent to proceed.  History of Present Illness Dale Meadows is a 63 year old male with ischemic cardiomyopathy and chronic combined heart failure who presents for follow-up. He was seen previously by Doctor Eldridge Dace.  He has a history of ischemic cardiomyopathy following an inferior ST elevation myocardial infarction in April 2015, treated with thrombectomy and PTCA of the RCA and PDA. His distal RCA was occluded at that time, with left to right collaterals feeding the distal RCA territory. His ejection fraction has remained stable at 40-45% with a mildly dilated aortic root. No current chest pain or shortness of breath. He uses Plavix for secondary prevention and nitrostat 0.4 mg as needed.  He has hyperlipidemia with an LDL goal of less than 55 due to his prior myocardial infarction. 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 He previously discontinued atorvastatin 40 mg daily due to myalgias and congestion. He reports compliance with his other medications.  His blood pressures have been under good control, although he has been intolerant to ACE inhibitors, ARBs, and beta blockers in the past due to hypertension.  He has a history of ulcerative colitis and has undergone colonoscopy previously. He is currently on mesalamine for this condition, which he reports has been effective. He sees a gastroenterologist annually.  He reports increased physical activity, including playing pickleball and basketball, although he admits to not being consistently  good with his diet.      Studies Reviewed: .        Results LABS LDL: 89 (10/2021) LDL: 201 (06/2023)  DIAGNOSTIC Echocardiogram: No evidence of ascending aortic dilatation Ejection Fraction: 40-45% (08/2021) Risk Assessment/Calculations:            Physical Exam:   VS:  BP 104/72   Pulse 73   Ht 6' (1.829 m)   Wt 186 lb 12.8 oz (84.7 kg)   SpO2 95%   BMI 25.33 kg/m    Wt Readings from Last 3 Encounters:  07/13/23 186 lb 12.8 oz (84.7 kg)  03/09/23 176 lb (79.8 kg)  01/05/23 180 lb 9.6 oz (81.9 kg)    GEN: Well nourished, well developed in no acute distress NECK: No JVD; No carotid bruits CARDIAC: RRR, no murmurs, no rubs, no gallops RESPIRATORY:  Clear to auscultation without rales, wheezing or rhonchi  ABDOMEN: Soft, non-tender, non-distended EXTREMITIES:  No edema; No deformity   ASSESSMENT AND PLAN: .    Assessment and Plan Assessment & Plan Ischemic Cardiomyopathy He has ischemic cardiomyopathy following an inferior ST elevation myocardial infarction in April 2015, treated with thrombectomy and PTCA of RCA and PDA. His ejection fraction was 40-45% in May 2023, indicating well-managed cardiac function. Intolerant to ACE inhibitors, ARBs, and beta blockers due to hypertension. On secondary prevention with Plavix and nitroglycerin as needed. Reports no current chest pain or dyspnea. Explained that Plavix and statins are commonly used together worldwide for secondary prevention and that concerns about liver damage from this combination are unfounded. - Continue Plavix 75 mg daily - Continue nitroglycerin 0.4  mg as needed  Hyperlipidemia He has hyperlipidemia with an LDL goal of less than 55 due to prior myocardial infarction. His LDL was 201 in March 2025, indicating extremely elevated levels compatible with possible familial hyperlipidemia. Previously discontinued atorvastatin due to myalgias also concerns about liver damage (ALT normal). Emphasized the importance  of managing LDL levels to prevent future cardiac events and discussed the potential genetic component of his hyperlipidemia. Current LDL levels pose a significant risk for future myocardial infarction, which could be fatal. Discussed the safety and efficacy of statins and the availability of non-statin options for lipid management. Referred to a lipid clinic to explore these options. - Refer to lipid clinic for evaluation and management of hyperlipidemia - Discuss non-statin options for lipid management at the lipid clinic  Ulcerative Colitis He has ulcerative colitis, well-managed with mesalamine. Undergoes regular monitoring of liver and kidney function due to potential side effects of mesalamine. No current issues reported. - Continue mesalamine - Continue annual follow-up with gastroenterologist         Signed, Donato Schultz, MD

## 2023-07-15 DIAGNOSIS — L814 Other melanin hyperpigmentation: Secondary | ICD-10-CM | POA: Diagnosis not present

## 2023-07-15 DIAGNOSIS — D225 Melanocytic nevi of trunk: Secondary | ICD-10-CM | POA: Diagnosis not present

## 2023-07-15 DIAGNOSIS — L821 Other seborrheic keratosis: Secondary | ICD-10-CM | POA: Diagnosis not present

## 2023-07-17 DIAGNOSIS — N401 Enlarged prostate with lower urinary tract symptoms: Secondary | ICD-10-CM | POA: Diagnosis not present

## 2023-07-17 DIAGNOSIS — R972 Elevated prostate specific antigen [PSA]: Secondary | ICD-10-CM | POA: Diagnosis not present

## 2023-07-17 DIAGNOSIS — R350 Frequency of micturition: Secondary | ICD-10-CM | POA: Diagnosis not present

## 2023-08-28 ENCOUNTER — Other Ambulatory Visit (HOSPITAL_COMMUNITY): Payer: Self-pay

## 2023-09-13 NOTE — Progress Notes (Signed)
 Patient ID: Dale Meadows                 DOB: 07/13/1960                    MRN: [id number]      HPI: Dale Meadows is a 63 y.o. male patient referred to lipid clinic by Dr.Skains. PMH is significant for ischemic cardiomyopathy and chronic combined heart failure, CAD, hx of MI, UC,prostate cancer, HLD  He has hyperlipidemia with an LDL goal of less than 55 due to his prior myocardial infarction. 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 He previously discontinued atorvastatin  40 mg daily due to myalgias and congestion   Patient presented today for lipid clinic. He is willing to try atorvastatin  again. We discussed risk factors and LDL goal. Reviewed options for lowering LDL cholesterol, including statins, ezetimibe, PCSK-9 inhibitors, bempedoic acid and inclisiran.  Discussed mechanisms of action, dosing, side effects and potential decreases in LDL cholesterol.  Also reviewed cost information and potential options for patient assistance.  Current Medications: none  Intolerances: Atorvastatin   Risk Factors: hx of MI at age of 44, CAD, CHF, HLD, LDL >200 suggest possible FH  LDL goal: <55 mg/dl  Last lab: TC 409, TG 811, HLD 47, LDL 201 (07/06/2023)  Diet: due to UC hard to eat lots of vegetables and plant based food  Very little fried food and red meat  Social history: Alcohol: none  Smoking :none  Exercise: plays  pickle ball  2-3 times per week and stays active   Family History:  Relation Problem Comments  Mother (Deceased) Diabetes   Diabetes type II   Hepatitis C   Hypertension     Father Metallurgist) Alcohol abuse   Arthritis   Asthma   Hearing loss   Hypertension     Brother Metallurgist) Alcohol abuse   Deep vein thrombosis   Drug abuse   Hypertension     Brother Metallurgist) Hypertension       Labs:  Lipid Panel     Component Value Date/Time   CHOL 276 (H) 07/06/2023 0817   TRIG 151  (H) 07/06/2023 0817   HDL 47 07/06/2023 0817   CHOLHDL 5.9 (H) 07/06/2023 0817   CHOLHDL 4 03/06/2021 0803   VLDL 18.8 03/06/2021 0803   LDLCALC 201 (H) 07/06/2023 0817   LABVLDL 28 07/06/2023 0817    Past Medical History:  Diagnosis Date   Allergy    CAD (coronary artery disease)    a. s/p inferior STEMI (07/2013)   Cancer (HCC) 05/29/2013   prostate   Hematuria    HLD (hyperlipidemia)    Junctional bradycardia    Myocardial infarction (HCC)    Seizures (HCC)    last seizure 26 years ago   Ulcerative colitis (HCC)    Ulcerative colitis (HCC)     Current Outpatient Medications on File Prior to Visit  Medication Sig Dispense Refill   Cholecalciferol (VITAMIN D3) 10 MCG (400 UNIT) CAPS Take by mouth.     clopidogrel  (PLAVIX ) 75 MG tablet TAKE 1 TABLET BY MOUTH DAILY 90 tablet 1   LIALDA  1.2 G EC tablet Take 1.2 g by mouth daily with breakfast.      nitroGLYCERIN  (NITROSTAT ) 0.4 MG SL tablet DISSOLVE 1 TABLET UNDER THE TONGUE EVERY 5 MINUTES AS  NEEDED FOR CHEST PAIN,NOT  TO EXCEED 3 DOSES IN 15 MIN IF PAIN  PERSISTS, CALL 911 100 tablet 0   vitamin B-12 (CYANOCOBALAMIN) 1000 MCG tablet Take 1,000 mcg by mouth daily.     No current facility-administered medications on file prior to visit.    No Known Allergies  Assessment/Plan:  1. Hyperlipidemia -  Problem  Hld (Hyperlipidemia)   Current Medications: none  Intolerances: Atorvastatin  - mild myalgia  Risk Factors: hx of MI at age of 45, CAD, CHF, HLD, LDL >200 suggest possible FH  LDL goal: <55 mg/dl  Last lab: TC 829, TG 562, HLD 47, LDL 201 (07/06/2023)- off of statins     HLD (hyperlipidemia) Assessment:  LDL goal: <55 mg/dl last LDLc 130 mg/dl (86/57/8469) LDL was 629 mg/dl while on Lipitor  40 mg daily, experienced myalgia but it was manageable Pt has stopped taking statin LDL went up 201 mg/dl   Discussed all lipid lowering options ( Statins, Zetia PCSK-9 inhibitors, bempedoic acid and inclisiran); cost, dosing  efficacy, side effects and primary/secondary prevention benefits of each options  Follows fairly healthy diet,and stays active   Plan: Want to re try atorvastatin  40 mg if not tolerated will lower dose to 20 mg daily or even 10 mg daily  Statin alone will not be enough to lower LDL to goal will initiate PA work of PCSK9i  Lipid lab due in 2-3 months of therapy optimization     Thank you,  Nickola Baron, Pharm.D [facility] [address], [place], [place] [address] Phone: [phone]; Fax: [phone]

## 2023-09-14 ENCOUNTER — Encounter: Payer: Self-pay | Admitting: Pharmacist

## 2023-09-14 ENCOUNTER — Other Ambulatory Visit (HOSPITAL_COMMUNITY): Payer: Self-pay

## 2023-09-14 ENCOUNTER — Telehealth: Payer: Self-pay | Admitting: Pharmacist

## 2023-09-14 ENCOUNTER — Ambulatory Visit: Attending: Cardiology | Admitting: Pharmacist

## 2023-09-14 ENCOUNTER — Telehealth: Payer: Self-pay | Admitting: Pharmacy Technician

## 2023-09-14 DIAGNOSIS — E782 Mixed hyperlipidemia: Secondary | ICD-10-CM | POA: Diagnosis not present

## 2023-09-14 MED ORDER — ATORVASTATIN CALCIUM 40 MG PO TABS: 40.0000 mg | ORAL_TABLET | Freq: Every day | ORAL | 3 refills | Status: DC

## 2023-09-14 MED ORDER — REPATHA SURECLICK 140 MG/ML ~~LOC~~ SOAJ: 140.0000 mg | SUBCUTANEOUS | 3 refills | Status: AC

## 2023-09-14 NOTE — Patient Instructions (Signed)
 Your Results:             Your most recent labs Goal  Total Cholesterol  < 200  Triglycerides 151276 < 150  HDL (happy/good cholesterol) 47 > 40  LDL (lousy/bad cholesterol 201 < 55   Medication changes: We will start the process to get PCSK9i( Repatha  or Praluent)  covered by your insurance.  Once the prior authorization is complete, we will call you to let you know and confirm pharmacy information.   Praluent is a cholesterol medication that improved your body's ability to get rid of "bad cholesterol" known as LDL. It can lower your LDL up to 60%. It is an injection that is given under the skin every 2 weeks. The most common side effects of Praluent include runny nose, symptoms of the common cold, rarely flu or flu-like symptoms, back/muscle pain in about 3-4% of the patients, and redness, pain, or bruising at the injection site.    Repatha  is a cholesterol medication that improved your body's ability to get rid of "bad cholesterol" known as LDL. It can lower your LDL up to 60%! It is an injection that is given under the skin every 2 weeks. The medication often requires a prior authorization from your insurance company. The most common side effects of Repatha  include runny nose, symptoms of the common cold, rarely flu or flu-like symptoms, back/muscle pain in about 3-4% of the patients, and redness, pain, or bruising at the injection site.   Lab orders: We want to repeat labs after 2-3 months.  We will send you a lab order to remind you once we get closer to that time.

## 2023-09-14 NOTE — Assessment & Plan Note (Signed)
 Assessment:  LDL goal: <55 mg/dl last LDLc 703 mg/dl (50/12/3816) LDL was 299 mg/dl while on Lipitor  40 mg daily, experienced myalgia but it was manageable Pt has stopped taking statin LDL went up 201 mg/dl   Discussed all lipid lowering options ( Statins, Zetia PCSK-9 inhibitors, bempedoic acid and inclisiran); cost, dosing efficacy, side effects and primary/secondary prevention benefits of each options  Follows fairly healthy diet,and stays active   Plan: Want to re try atorvastatin  40 mg if not tolerated will lower dose to 20 mg daily or even 10 mg daily  Statin alone will not be enough to lower LDL to goal will initiate PA work of PCSK9i  Lipid lab due in 2-3 months of therapy optimization

## 2023-09-14 NOTE — Telephone Encounter (Signed)
 Pharmacy Patient Advocate Encounter  Received notification from [employer] that Prior Authorization for REPATHA  has been APPROVED from 09/14/23 to 03/12/24. Ran test claim, Copay is $47.00. This test claim was processed through [facility]- copay amounts may vary at other pharmacies due to pharmacy/plan contracts, or as the patient moves through the different stages of their insurance plan.   PA #/Case ID/Reference #: [id number]

## 2023-09-14 NOTE — Addendum Note (Signed)
 Addended by: Kwamaine Cuppett K on: 09/14/2023 02:49 PM   Modules accepted: Orders

## 2023-09-15 LAB — LIPOPROTEIN A (LPA): Lipoprotein (a): 24.1 nmol/L (ref <75.0)

## 2023-09-16 ENCOUNTER — Ambulatory Visit: Payer: Self-pay | Admitting: Pharmacist

## 2023-09-16 DIAGNOSIS — E7849 Other hyperlipidemia: Secondary | ICD-10-CM

## 2023-09-16 NOTE — Telephone Encounter (Signed)
 Repatha  PA approved see other encounter for more info

## 2023-09-30 IMAGING — MR MR KNEE*L* W/O CM
4 of 7 series · 21 of 40 positions shown · non-contrast
Comparison: CT scan 09/01/2021

CLINICAL DATA: Left knee pain and swelling for 4 weeks. Knee popped
while walking down stairs.

EXAM:
MRI OF THE LEFT KNEE WITHOUT CONTRAST
TECHNIQUE: Multiplanar, multisequence MR imaging of the knee was performed. No
intravenous contrast was administered.

[Series 3: T2 fat-sat · axial · 4.0mm · 0.50mm/px · z∈[-110,+20]mm · 4 of 27 slices shown]
[im 1/27]
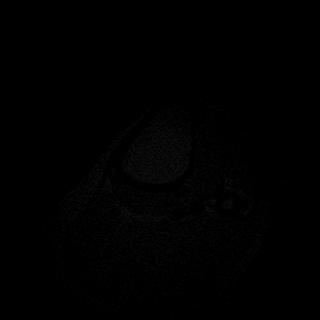
[im 6/27]
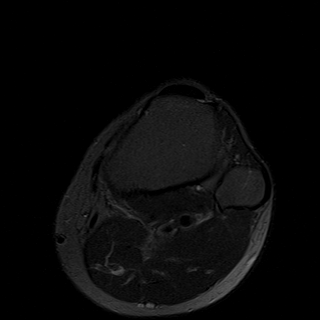
[im 16/27]
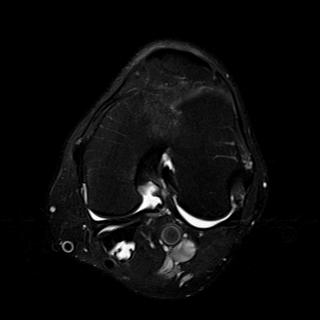
[im 27/27]
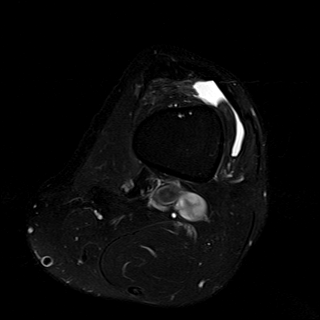

[Series 7: PD fat-sat · sagittal · 3.0mm · 0.29mm/px · 7 of 27 slices shown (1 of 3)]
[im 1/27]
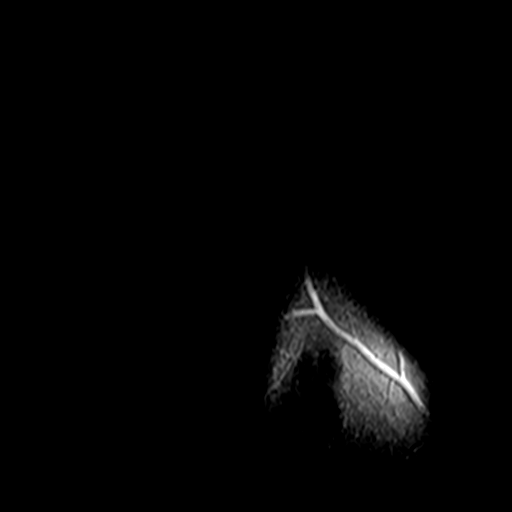
[im 5/27]
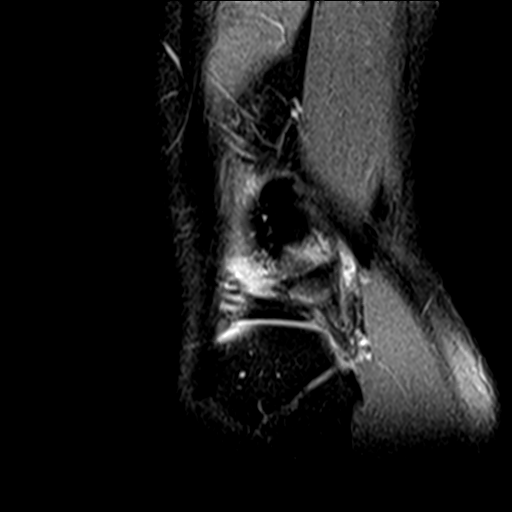
[im 9/27]
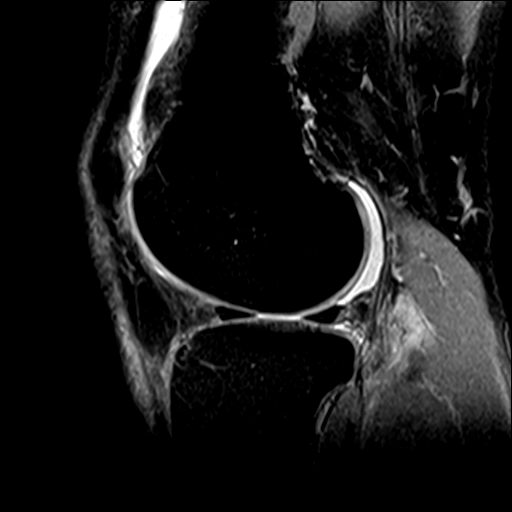
[im 14/27]
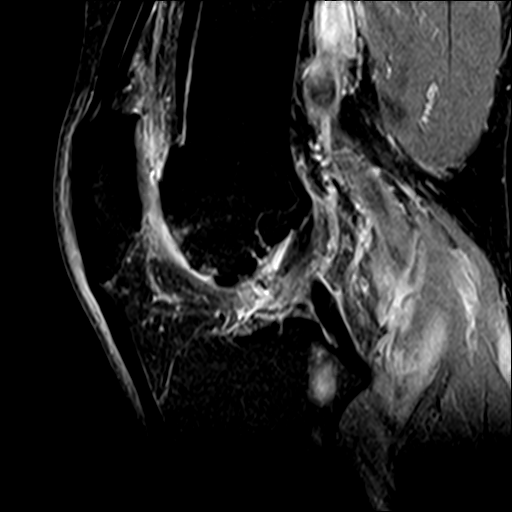
[im 18/27]
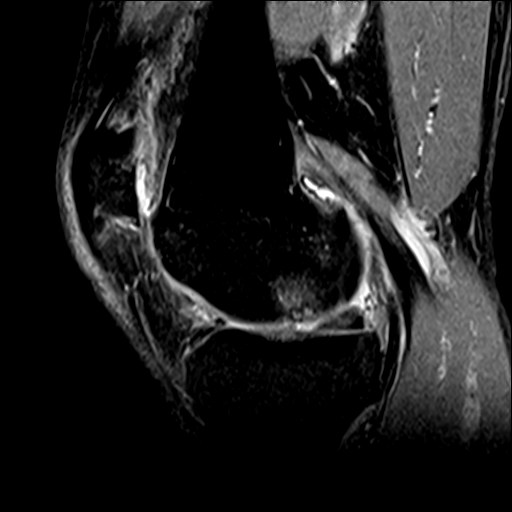
[im 22/27]
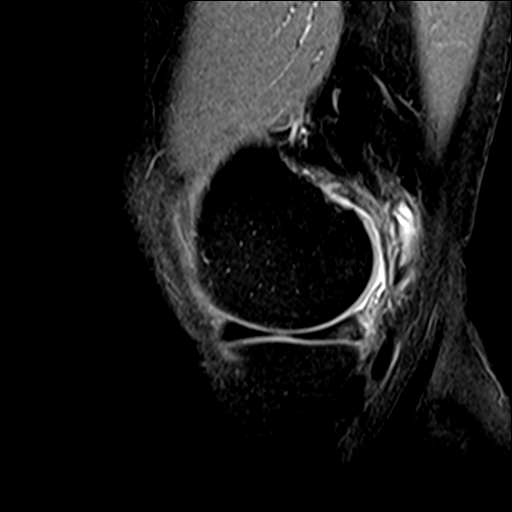
[im 27/27]
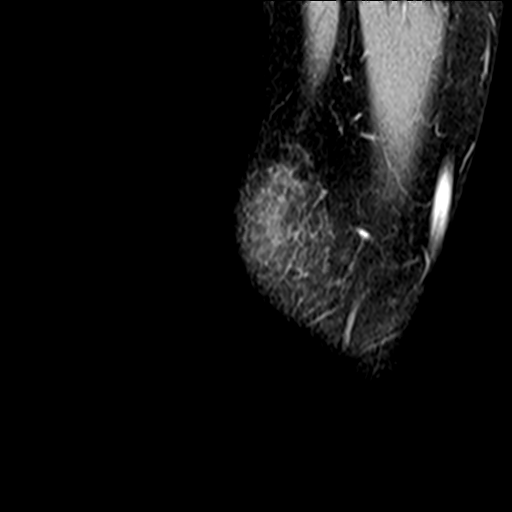

[Series 8: PD fat-sat · coronal · 3.0mm · 0.29mm/px · 7 of 27 slices shown (2 of 3)]
[im 1/27]
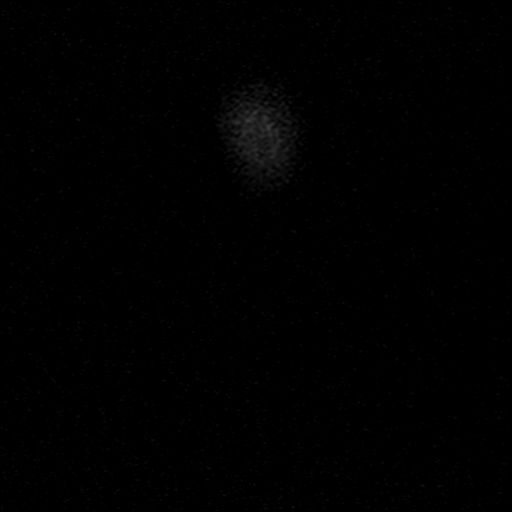
[im 5/27]
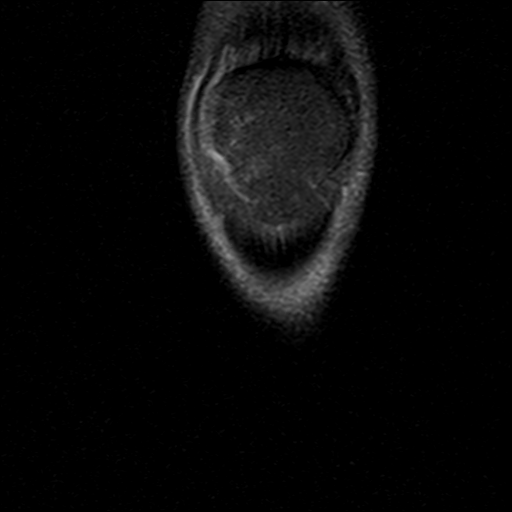
[im 9/27]
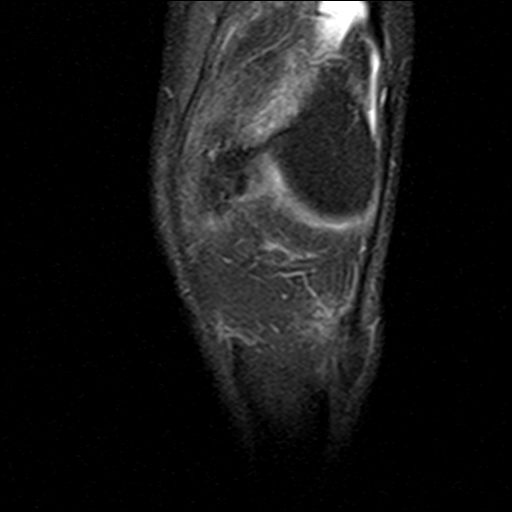
[im 14/27]
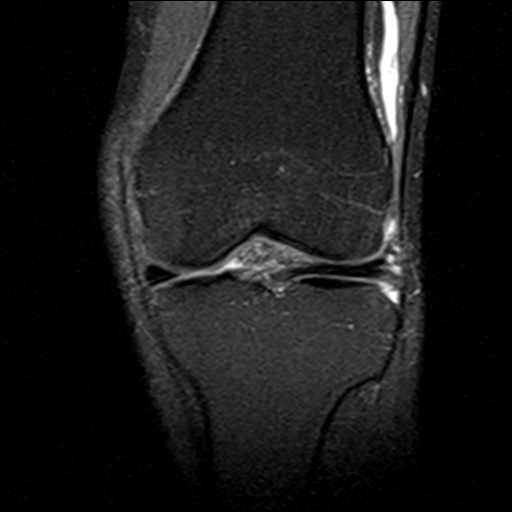
[im 18/27]
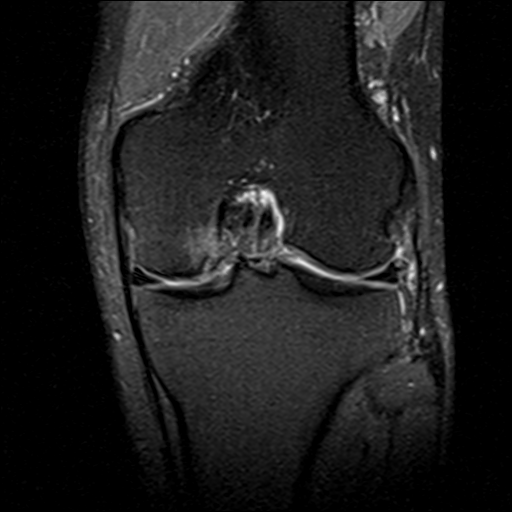
[im 22/27]
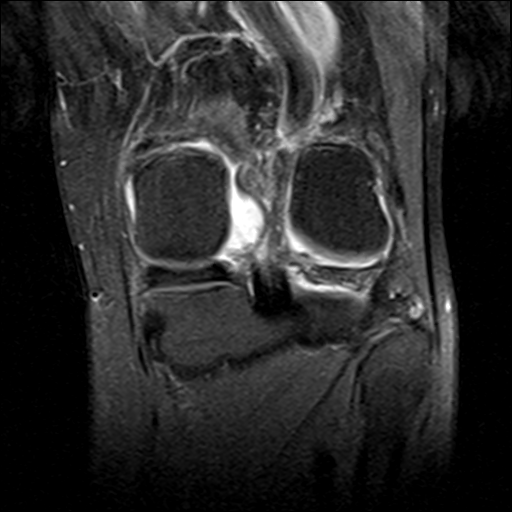
[im 27/27]
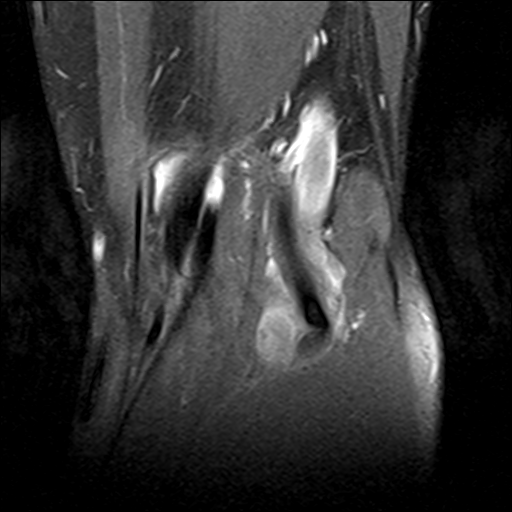

[Series 9: PD fat-sat · coronal · 2.0mm · 0.29mm/px · 3 of 11 slices shown (3 of 3)]
[im 1/11]
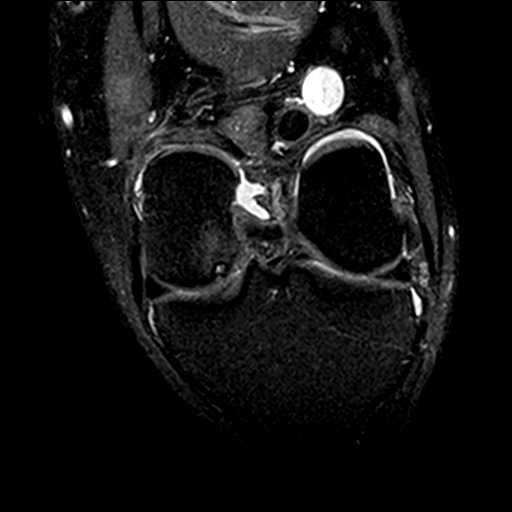
[im 6/11]
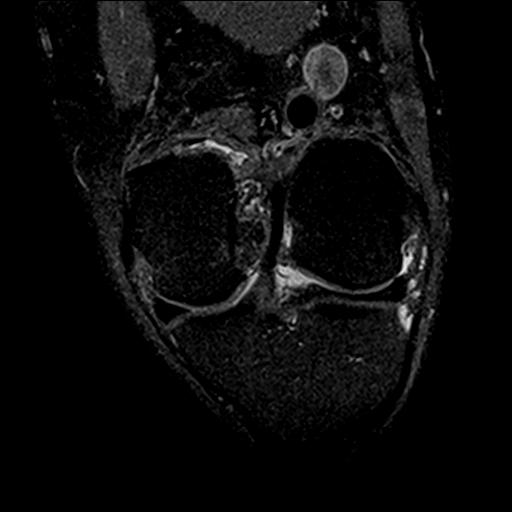
[im 11/11]
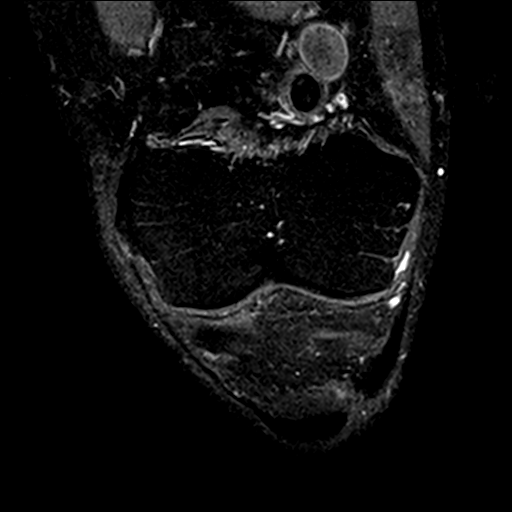

[21 of 40 positions shown; findings below may reference images not displayed]

FINDINGS: MENISCI

Medial meniscus: Shallow inferior articular surface tear involving
the posterior horn along with intrasubstance degenerative changes.

Lateral meniscus:  Intact

LIGAMENTS

Cruciates:  Intact

Collaterals:  Intact

CARTILAGE

Patellofemoral: Full-thickness cartilage defect involving the upper
medial aspect of the patella. This measures at least 10 mm in size.
I do not see a definite osteochondral lesion but there is definitely
bony irregularity involving the subchondral plate also in retrospect
noted on the CT scan. Loose cartilaginous body is not well
demonstrated.

Medial: Moderate degenerative chondrosis with small defects
involving the lateral aspect of the medial femoral condyle. Some
surrounding marrow edema.

Lateral:  Mild degenerative chondrosis.

Joint:  Moderate-sized joint effusion.

Popliteal Fossa:  Small Baker's cyst containing some debris.

Extensor Mechanism: The patella retinacular structures are intact
and the quadriceps and patellar tendons are intact.

Bones:  No acute fracture.

Other: Unremarkable knee musculature.
IMPRESSION: 1. Shallow inferior articular surface tear involving the posterior
horn of the medial meniscus along with intrasubstance degenerative
changes.
2. Intact ligamentous structures and no acute bony findings.
3. Full-thickness cartilage defect involving the upper medial aspect
of the patella. This could be an acute delamination injury or large
flap type tear, particularly given the recent CT findings of a large
hemarthrosis.
4. Moderate-sized joint effusion and small Baker's cyst.

## 2023-10-06 NOTE — Telephone Encounter (Signed)
 Patient is following up regarding request to check cholesterol. He would like a call back soon if possible. Please advise.

## 2023-10-09 NOTE — Telephone Encounter (Signed)
 Spoke to patient, he want to see how much LDL lowering effect he gets from Atorvastatin  40 mg before he start Repatha . So lab ordered and patient will go for FLP  and LFT on July 25 ( 8 weeks post statin initiation).

## 2023-11-17 DIAGNOSIS — E7849 Other hyperlipidemia: Secondary | ICD-10-CM | POA: Diagnosis not present

## 2023-11-17 LAB — LIPID PANEL
Chol/HDL Ratio: 3.8 ratio (ref 0.0–5.0)
Cholesterol, Total: 178 mg/dL (ref 100–199)
HDL: 47 mg/dL (ref >39)
LDL Chol Calc (NIH): 108 mg/dL — ABNORMAL HIGH (ref 0–99)
Triglycerides: 131 mg/dL (ref 0–149)
VLDL Cholesterol Cal: 23 mg/dL (ref 5–40)

## 2023-11-18 ENCOUNTER — Ambulatory Visit: Payer: Self-pay | Admitting: Pharmacist

## 2023-11-18 DIAGNOSIS — I2510 Atherosclerotic heart disease of native coronary artery without angina pectoris: Secondary | ICD-10-CM

## 2023-11-18 LAB — HEPATIC FUNCTION PANEL
ALT: 40 IU/L (ref 0–44)
AST: 30 IU/L (ref 0–40)
Albumin: 4.5 g/dL (ref 3.9–4.9)
Alkaline Phosphatase: 86 IU/L (ref 44–121)
Bilirubin Total: 0.6 mg/dL (ref 0.0–1.2)
Bilirubin, Direct: 0.2 mg/dL (ref 0.00–0.40)
Total Protein: 7.1 g/dL (ref 6.0–8.5)

## 2023-11-18 NOTE — Addendum Note (Signed)
 Addended by: Trixie Maclaren K on: 11/18/2023 11:20 AM   Modules accepted: Orders

## 2023-12-09 ENCOUNTER — Emergency Department (HOSPITAL_BASED_OUTPATIENT_CLINIC_OR_DEPARTMENT_OTHER)
Admission: EM | Admit: 2023-12-09 | Discharge: 2023-12-09 | Disposition: A | Discharge status: Home / Self Care | Source: Ambulatory Visit | Attending: Emergency Medicine | Admitting: Emergency Medicine

## 2023-12-09 ENCOUNTER — Other Ambulatory Visit: Payer: Self-pay

## 2023-12-09 ENCOUNTER — Ambulatory Visit: Payer: Self-pay

## 2023-12-09 ENCOUNTER — Encounter (HOSPITAL_BASED_OUTPATIENT_CLINIC_OR_DEPARTMENT_OTHER): Payer: Self-pay

## 2023-12-09 ENCOUNTER — Emergency Department (HOSPITAL_BASED_OUTPATIENT_CLINIC_OR_DEPARTMENT_OTHER): Admitting: Radiology

## 2023-12-09 DIAGNOSIS — J9811 Atelectasis: Secondary | ICD-10-CM | POA: Diagnosis not present

## 2023-12-09 DIAGNOSIS — J984 Other disorders of lung: Secondary | ICD-10-CM | POA: Diagnosis not present

## 2023-12-09 DIAGNOSIS — I2510 Atherosclerotic heart disease of native coronary artery without angina pectoris: Secondary | ICD-10-CM | POA: Diagnosis not present

## 2023-12-09 DIAGNOSIS — R0902 Hypoxemia: Secondary | ICD-10-CM | POA: Diagnosis not present

## 2023-12-09 DIAGNOSIS — R0602 Shortness of breath: Secondary | ICD-10-CM | POA: Diagnosis not present

## 2023-12-09 DIAGNOSIS — R0600 Dyspnea, unspecified: Secondary | ICD-10-CM | POA: Insufficient documentation

## 2023-12-09 DIAGNOSIS — I5020 Unspecified systolic (congestive) heart failure: Secondary | ICD-10-CM | POA: Insufficient documentation

## 2023-12-09 LAB — BASIC METABOLIC PANEL WITH GFR
Anion gap: 15 (ref 5–15)
BUN: 20 mg/dL (ref 8–23)
CO2: 20 mmol/L — ABNORMAL LOW (ref 22–32)
Calcium: 10.2 mg/dL (ref 8.9–10.3)
Chloride: 105 mmol/L (ref 98–111)
Creatinine, Ser: 1.14 mg/dL (ref 0.61–1.24)
GFR, Estimated: >60 mL/min (ref >60)
Glucose, Bld: 103 mg/dL — ABNORMAL HIGH (ref 70–99)
Potassium: 4.9 mmol/L (ref 3.5–5.1)
Sodium: 140 mmol/L (ref 135–145)

## 2023-12-09 LAB — CBC
HCT: 46.9 % (ref 39.0–52.0)
Hemoglobin: 16 g/dL (ref 13.0–17.0)
MCH: 29.4 pg (ref 26.0–34.0)
MCHC: 34.1 g/dL (ref 30.0–36.0)
MCV: 86.1 fL (ref 80.0–100.0)
Platelets: 287 K/uL (ref 150–400)
RBC: 5.45 MIL/uL (ref 4.22–5.81)
RDW: 12.5 % (ref 11.5–15.5)
WBC: 8.4 K/uL (ref 4.0–10.5)
nRBC: 0 % (ref 0.0–0.2)

## 2023-12-09 LAB — PRO BRAIN NATRIURETIC PEPTIDE: Pro Brain Natriuretic Peptide: 163 pg/mL (ref <300.0)

## 2023-12-09 LAB — D-DIMER, QUANTITATIVE: D-Dimer, Quant: 0.46 ug{FEU}/mL (ref 0.00–0.50)

## 2023-12-09 LAB — TROPONIN T, HIGH SENSITIVITY: Troponin T High Sensitivity: <15 ng/L (ref 0–19)

## 2023-12-09 NOTE — ED Notes (Addendum)
 Pt ambulatory around department with pulse ox on finger, sp02 stayed at 93-94%.

## 2023-12-09 NOTE — ED Triage Notes (Signed)
 Arrives POV with complaints of shortness of breath that started today while the patient was working in the yard. Patient reports that his smart watch was saying his oxygen was 88%-90% on room air.

## 2023-12-09 NOTE — Telephone Encounter (Signed)
 Noted

## 2023-12-09 NOTE — Telephone Encounter (Signed)
 FYI Only or Action Required?: FYI only for provider.  Patient was last seen in primary care on 03/09/2023 by [name], MD.  Called Nurse Triage reporting hypoxia and Shortness of Breath.  Symptoms began today.  Interventions attempted: Rest, hydration, or home remedies.  Symptoms are: SOB, lightheaded, hypoxia (SpO2 87-90%) unchanged.  Triage Disposition: Go to ED Now (Notify PCP)  Patient/caregiver understands and will follow disposition?: Yes              Copied from CRM [id number]. Topic: Clinical - Red Word Triage >> Dec 09, 2023 11:09 AM [name] wrote: Red Word that prompted transfer to Nurse Triage: shortness of breath   Pt stated that his oxygen levels are in the 88-90 range and wanted to speak with PCP for medical advice. Pt stated that he is also experiencing shortness of breath. Reason for Disposition  Oxygen level (e.g., pulse oximetry) 90% or lower  Answer Assessment - Initial Assessment Questions Patient states he did yard work earlier this morning for about an hour. He has noticed his symptoms since then. Patient states his baseline SpO2 is usually low 90s. However, per chart patient's last visits show 95-96%. Advised patient to go to ED due to hypoxia and he states his wife will drive him.   1. RESPIRATORY STATUS: Describe your breathing? (e.g., wheezing, shortness of breath, unable to speak, severe coughing)      Shortness of breath.  2. ONSET: When did this breathing problem begin?      Today.  3. PATTERN Does the difficult breathing come and go, or has it been constant since it started?      Constant the last half hour, he states he has been sitting.  4. SEVERITY: How bad is your breathing? (e.g., mild, moderate, severe)      Mild.  5. RECURRENT SYMPTOM: Have you had difficulty breathing before? If Yes, ask: When was the last time? and What happened that time?      Yes, he states he usually gets SOB with exertion from an old  MI.  6. CARDIAC HISTORY: Do you have any history of heart disease? (e.g., heart attack, angina, bypass surgery, angioplasty)      STEMI, CAD.  7. LUNG HISTORY: Do you have any history of lung disease?  (e.g., pulmonary embolus, asthma, emphysema)     No.  8. CAUSE: What do you think is causing the breathing problem?      He states he thinks he exerted himself physically with the yard work.  9. OTHER SYMPTOMS: Do you have any other symptoms? (e.g., chest pain, cough, dizziness, fever, runny nose)     Denies chest pain, nausea, vomiting, sweating. He states he has slight dizziness when he came in from the yard work and showered, describes it as lightheadedness. He also states his heart rate was elevated after yard work.  10. O2 SATURATION MONITOR:  Do you use an oxygen saturation monitor (pulse oximeter) at home? If Yes, ask: What is your reading (oxygen level) today? What is your usual oxygen saturation reading? (e.g., 95%)       Ranging 87-90%.  11. PREGNANCY: Is there any chance you are pregnant? When was your last menstrual period?       N/A.  12. TRAVEL: Have you traveled out of the country in the last month? (e.g., travel history, exposures)       No.  Protocols used: Breathing Difficulty-A-AH

## 2023-12-09 NOTE — ED Provider Notes (Signed)
 [place] [facility]MENT AT [facility]  Provider Note  CSN: [id number] Arrival date & time: 12/09/23 1145  History Chief Complaint  Patient presents with   Shortness of Breath    Dale Meadows is a 63 y.o. male with history of HLD, CAD s/p STEMI in 2015 with occlusions not amenable to intervention and subsequent ischemic cardiomyopathy with combined CHF, EF at 40%. He has been doing well in recent months, although noted to have markedly elevated LDL earlier this year and is working with Lipid clinic on improvement (on atorvastatin , considering Repatha ). Today he was working in the yard when he began having some dyspnea, no CP. He went inside, rested, took a shower and then checked his vitals noted his HR was 116 and SpO2 was persistently 87-90%. Has improved some since arrival. He reports his baseline is mid-90s. He has not had any recent travel, leg swelling or weight gain.    Home Medications Prior to Admission medications   Medication Sig Start Date End Date Taking? Authorizing Provider  atorvastatin  (LIPITOR ) 40 MG tablet Take 1 tablet (40 mg total) by mouth daily. 09/14/23 12/13/23  Jeffrie Oneil BROCKS, MD  Cholecalciferol (VITAMIN D3) 10 MCG (400 UNIT) CAPS Take by mouth.    [provider]  clopidogrel  (PLAVIX ) 75 MG tablet TAKE 1 TABLET BY MOUTH DAILY 07/01/23   Dick, Ernest H Jr., NP  Evolocumab  (REPATHA  SURECLICK) 140 MG/ML SOAJ Inject 140 mg into the skin every 14 (fourteen) days. 09/14/23   Jeffrie Oneil BROCKS, MD  LIALDA  1.2 G EC tablet Take 1.2 g by mouth daily with breakfast.  10/05/14   [provider]  nitroGLYCERIN  (NITROSTAT ) 0.4 MG SL tablet DISSOLVE 1 TABLET UNDER THE TONGUE EVERY 5 MINUTES AS  NEEDED FOR CHEST PAIN,NOT  TO EXCEED 3 DOSES IN 15 MIN IF PAIN PERSISTS, CALL 911 07/21/19   Dann Candyce RAMAN, MD  vitamin B-12 (CYANOCOBALAMIN) 1000 MCG tablet Take 1,000 mcg by mouth daily.    [provider]     Allergies    Patient has no  known allergies.   Review of Systems   Review of Systems Please see HPI for pertinent positives and negatives  Physical Exam BP 114/77   Pulse 64   Temp 98.4 F (36.9 C) (Oral)   Resp 12   Ht 6' (1.829 m)   Wt 83.9 kg   SpO2 93%   BMI 25.09 kg/m   Physical Exam Vitals and nursing note reviewed.  Constitutional:      Appearance: Normal appearance.  HENT:     Head: Normocephalic and atraumatic.     Nose: Nose normal.     Mouth/Throat:     Mouth: Mucous membranes are moist.  Eyes:     Extraocular Movements: Extraocular movements intact.     Conjunctiva/sclera: Conjunctivae normal.  Cardiovascular:     Rate and Rhythm: Normal rate.  Pulmonary:     Effort: Pulmonary effort is normal.     Breath sounds: Rales (faint, in bases) present.  Abdominal:     General: Abdomen is flat.     Palpations: Abdomen is soft.     Tenderness: There is no abdominal tenderness.  Musculoskeletal:        General: No swelling. Normal range of motion.     Cervical back: Neck supple.     Right lower leg: No edema.     Left lower leg: No edema.  Skin:    General: Skin is warm and dry.  Neurological:     General: No focal deficit present.     Mental Status: He is alert.  Psychiatric:        Mood and Affect: Mood normal.     ED Results / Procedures / Treatments   EKG EKG Interpretation Date/Time:  Wednesday December 09 2023 12:01:44 EDT Ventricular Rate:  76 PR Interval:  216 QRS Duration:  114 QT Interval:  374 QTC Calculation: 421 R Axis:   90  Text Interpretation: Sinus rhythm Borderline prolonged PR interval Inferolateral infarct, old No significant change since last tracing Confirmed by Roselyn Dunnings [phone]) on 12/09/2023 12:06:41 PM  Procedures Procedures  Medications Ordered in the ED Medications - No data to display  Initial Impression and Plan  Patient with prior CAD, HFrEF and HLD here for new hypoxia after working in the yard. Improved on arrival here. Will check  labs, CXR, EKG. Continue to monitor on room air.   ED Course   Clinical Course as of 12/09/23 1351  Wed Dec 09, 2023  1249 CBC is normal.  [CS]  1300 Dimer is negative.  [CS]  1304 BNP and Trop are negative. BMP is unremarkable.  [CS]  1330 I personally viewed the images from radiology studies and agree with radiologist interpretation: CXR is clear. Patient's SpO2 remains 92% on room air at rest, will check ambulatory SpO2.  [CS]  1347 Patient able to ambulate without SOB or hypoxia, SpO2 stayed 94-94%. No clear etiology of his hypoxia at home but does not appear to be an acute life threatening etiology. He is comfortable going home. Recommend he continue to monitor pulse ox at home, follow up with Cardiology. RTED for any worsening.  [CS]    Clinical Course User Index [CS] Roselyn Dunnings NOVAK, MD     MDM Rules/Calculators/A&P Medical Decision Making Given presenting complaint, I considered that admission might be necessary. After review of results from ED lab and/or imaging studies, admission to the hospital is not indicated at this time.    Problems Addressed: Hypoxia: undiagnosed new problem with uncertain prognosis  Amount and/or Complexity of Data Reviewed Labs: ordered. Decision-making details documented in ED Course. Radiology: ordered and independent interpretation performed. Decision-making details documented in ED Course. ECG/medicine tests: ordered and independent interpretation performed. Decision-making details documented in ED Course.  Risk Decision regarding hospitalization.     Final Clinical Impression(s) / ED Diagnoses Final diagnoses:  Hypoxia    Rx / DC Orders ED Discharge Orders          Ordered    Ambulatory referral to Cardiology       Comments: If you have not heard from the Cardiology office within the next 72 hours please call [phone].   12/09/23 1350             Roselyn Dunnings NOVAK, MD 12/09/23 1351

## 2023-12-18 ENCOUNTER — Ambulatory Visit (HOSPITAL_BASED_OUTPATIENT_CLINIC_OR_DEPARTMENT_OTHER)
Admission: RE | Admit: 2023-12-18 | Discharge: 2023-12-18 | Disposition: A | Discharge status: Home / Self Care | Payer: Self-pay | Source: Ambulatory Visit | Attending: Cardiology | Admitting: Cardiology

## 2023-12-18 DIAGNOSIS — I2510 Atherosclerotic heart disease of native coronary artery without angina pectoris: Secondary | ICD-10-CM | POA: Insufficient documentation

## 2023-12-21 ENCOUNTER — Ambulatory Visit: Payer: Self-pay | Admitting: Cardiology

## 2023-12-26 ENCOUNTER — Other Ambulatory Visit: Payer: Self-pay | Admitting: Nurse Practitioner

## 2024-01-01 ENCOUNTER — Telehealth: Payer: Self-pay | Admitting: Pharmacist

## 2024-01-01 DIAGNOSIS — E7849 Other hyperlipidemia: Secondary | ICD-10-CM

## 2024-01-01 NOTE — Telephone Encounter (Signed)
 Post CAC scoring test patient started Repatha . Took 1st dose 1.5 weeks ago. F/u lab is due on Mar 07, 2024.

## 2024-01-20 DIAGNOSIS — E7849 Other hyperlipidemia: Secondary | ICD-10-CM | POA: Diagnosis not present

## 2024-01-20 DIAGNOSIS — L720 Epidermal cyst: Secondary | ICD-10-CM | POA: Diagnosis not present

## 2024-01-20 DIAGNOSIS — R202 Paresthesia of skin: Secondary | ICD-10-CM | POA: Diagnosis not present

## 2024-01-20 LAB — LIPID PANEL
Chol/HDL Ratio: 1.8 ratio (ref 0.0–5.0)
Cholesterol, Total: 86 mg/dL — ABNORMAL LOW (ref 100–199)
HDL: 49 mg/dL (ref >39)
LDL Chol Calc (NIH): 24 mg/dL (ref 0–99)
Triglycerides: 50 mg/dL (ref 0–149)
VLDL Cholesterol Cal: 13 mg/dL (ref 5–40)

## 2024-01-22 ENCOUNTER — Ambulatory Visit: Payer: Self-pay | Admitting: Pharmacist

## 2024-01-25 ENCOUNTER — Telehealth: Payer: Self-pay | Admitting: Pharmacist

## 2024-01-25 NOTE — Telephone Encounter (Signed)
 Pt returning pharmd call. Please advise.

## 2024-01-27 ENCOUNTER — Ambulatory Visit: Attending: Internal Medicine | Admitting: Emergency Medicine

## 2024-01-27 ENCOUNTER — Encounter: Payer: Self-pay | Admitting: Emergency Medicine

## 2024-01-27 VITALS — BP 100/64 | HR 70 | Ht 72.0 in | Wt 186.4 lb

## 2024-01-27 DIAGNOSIS — E782 Mixed hyperlipidemia: Secondary | ICD-10-CM

## 2024-01-27 DIAGNOSIS — I255 Ischemic cardiomyopathy: Secondary | ICD-10-CM

## 2024-01-27 DIAGNOSIS — I2510 Atherosclerotic heart disease of native coronary artery without angina pectoris: Secondary | ICD-10-CM

## 2024-01-27 MED ORDER — ATORVASTATIN CALCIUM 10 MG PO TABS: 10.0000 mg | ORAL_TABLET | Freq: Every day | ORAL | 3 refills | Status: AC

## 2024-01-27 NOTE — Progress Notes (Signed)
 Cardiology Office Note:    Date:  01/27/2024  ID:  Dale Meadows, DOB [birth year], MRN [id number] PCP: [name], MD  [facility] Providers Cardiologist:  Oneil Parchment, MD Cardiology APP:  Rana Lum CROME, NP       Patient Profile:       Chief Complaint: Acute visit to discuss hyperlipidemia History of Present Illness:  Dale Meadows is a 63 y.o. male with visit-pertinent history of ischemic cardiomyopathy following MI in April 2015, hyperlipidemia, hypertension, ulcerative colitis  He does have history of ischemic myopathy following an inferior ST elevation MI in April 2015, treated with thrombectomy and PTCA of the RCA and PDA.  His distal RCA was occluded at the time with left-to-right collaterals feeding the distal RCA territory.  His ejection fraction had remained stable at 40 to 45% with mildly dilated aortic root.  He was last seen in clinic on 07/13/2023 by Dr. Parchment.  He was without anginal symptoms.  His LDL was 201 in March 2025.  Previously discontinued atorvastatin  due to myalgias.  He was referred to lipid clinic.  He was seen by Pharm.D. on 09/14/2023.  Plan was to retry atorvastatin  40 mg daily and start Repatha .  He was hesitant to start Repatha  and was interested in a coronary calcium  score prior.  Coronary calcium  scoring on 12/18/2023 showed coronary calcium  score 213 (68th percentile).  Per CAC scoring he started Repatha .   Discussed the use of AI scribe software for clinical note transcription with the patient, who gave verbal consent to proceed.  History of Present Illness Dale Meadows is a 63 year old male with coronary artery disease and a history of myocardial infarction who presents for follow-up regarding cholesterol management. He is accompanied by his wife.   He presents with no acute cardiovascular concerns today.  No chest pains, orthopnea, PND, syncope, presyncope, lightheadedness, dizziness, palpitations.  No changes to chronic  mild dyspnea.  Maintains active lifestyle without any exertional symptoms.  In August, he experienced low blood oxygen levels, with home readings initially below 92 and in the high 80s, accompanied by fatigue and dizziness. At the ER, oxygen levels were normal and he had unremarkable workup.  Denies any further hypoxic episodes.  He feels winded when climbing stairs since his myocardial infarction in 2015 without significant changes.  He manages his cholesterol with atorvastatin  and Repatha .  He is concerned that his total cholesterol level has dropped below 100.  He is interested in lowering his atorvastatin .  He would also like to discuss his prior MI as well as recent CT coronary scan.  Review of systems:  Please see the history of present illness. All other systems are reviewed and otherwise negative.      Studies Reviewed:    EKG Interpretation Date/Time:  Wednesday January 27 2024 10:42:27 EDT Ventricular Rate:  70 PR Interval:  212 QRS Duration:  114 QT Interval:  396 QTC Calculation: 427 R Axis:   5  Text Interpretation: Sinus rhythm with 1st degree A-V block Lateral infarct , age undetermined Inferior infarct , age undetermined When compared with ECG of 09-Dec-2023 12:01, PREVIOUS ECG IS PRESENT Confirmed by Rana Lum [phone] on 01/27/2024 1:32:03 PM    CT cardiac scoring 12/18/2023 Coronary calcium  score of 213. This was 68th percentile for age-, race-, and sex-matched controls.  Echocardiogram 09/09/2021  1. Left ventricular ejection fraction, by estimation, is 40 to 45%. The  left ventricle has mildly decreased function. The left ventricle  demonstrates regional  wall motion abnormalities (see scoring  diagram/findings for description). There is mild  asymmetric left ventricular hypertrophy of the basal-septal segment. Left  ventricular diastolic parameters are consistent with Grade I diastolic  dysfunction (impaired relaxation).   2. Right ventricular systolic  function is normal. The right ventricular  size is normal. Tricuspid regurgitation signal is inadequate for assessing  PA pressure.   3. The mitral valve is grossly normal. Trivial mitral valve  regurgitation. No evidence of mitral stenosis.   4. The aortic valve is tricuspid. Aortic valve regurgitation is not  visualized. No aortic stenosis is present.   Echocardiogram 12/13/2018  1. The left ventricle has mild-moderately reduced systolic function, with  an ejection fraction of 40-45%. The cavity size was normal. Left  ventricular diastolic Doppler parameters are consistent with impaired  relaxation. Left ventricular diffuse  hypokinesis.   2. The right ventricle has normal systolic function. The cavity was  normal. There is no increase in right ventricular wall thickness. Right  ventricular systolic pressure could not be assessed.   3. The aortic valve is tricuspid. Mild sclerosis of the aortic valve.  Aortic valve regurgitation was not assessed by color flow Doppler.   4. The aorta is abnormal unless otherwise noted.   5. There is mild dilatation of the aortic root measuring 39 mm.  Risk Assessment/Calculations:              Physical Exam:   VS:  BP 100/64   Pulse 70   Ht 6' (1.829 m)   Wt 186 lb 6.4 oz (84.6 kg)   SpO2 98%   BMI 25.28 kg/m    Wt Readings from Last 3 Encounters:  01/27/24 186 lb 6.4 oz (84.6 kg)  12/09/23 185 lb (83.9 kg)  07/13/23 186 lb 12.8 oz (84.7 kg)    GEN: Well nourished, well developed in no acute distress NECK: No JVD; No carotid bruits CARDIAC: RRR, no murmurs, rubs, gallops RESPIRATORY:  Clear to auscultation without rales, wheezing or rhonchi  ABDOMEN: Soft, non-tender, non-distended EXTREMITIES:  No edema; No acute deformity      Assessment and Plan:  Coronary artery disease History of aneurysmal RCA that occluded causing inferior STEMI in 2015.  This was unable to be opened successfully.  At the time of cath in 2015 he had patent left  coronary artery system with left-to-right collaterals feeding the distal RCA territory.  Echocardiogram at that time showed mild-moderately reduced LVEF at 40 to 45% Managed on clopidogrel  given aneurysmal areas of coronary vessels and CTO of RCA - Today he is stable without anginal symptoms.  Remains physically active without exertional chest pains.  No symptoms to suggest active angina.  No indication for further ischemic evaluation at this time.   - Continue atorvastatin  10 mg daily - Continue clopidogrel  75 mg daily - Continue Repatha  140 mg q. 14 days  Ischemic cardiomyopathy LVEF at time of MI in 2015 at 40 to 45% LVEF 40-45% on 11/2018 LVEF 40-45% on 08/2021, WMA consistent with prior RCA infarct He did not tolerate ACE/ARB or beta-blocker due to hypotension in the past - Today he appears euvolemic and well compensated on exam - He has very good functional status.  NYHA class II - No orthopnea, PND, LEE.  Mild and unchanged DOE since MI in 2015 - He does not require loop diuretic therapy - Continue current medical therapy  Hyperlipidemia, LDL goal <55 LDL 116 on 12/2022 LDL 201 on 06/2023 LDL 108 on 10/2023 Most recent  LDL of 24 on 12/2023 and now well-controlled Previously discontinued atorvastatin  due to myalgias and concerns about liver damage (ALT was normal) Follow-up with Pharm.D. on 5/19.  He was restarted on atorvastatin  40 mg as well as Repatha  - Today he is without myalgias but is concerned that his total cholesterol is now less than 100 since starting Repatha .  Much education given today on both statin lowering therapy and PCSK9 inhibitors.  He has much better understanding - Although no side effects on atorvastatin , through shared decision making we have agreed to lower atorvastatin  to 10 mg daily given his overall total cholesterol of 86 - We will recheck lipid panel/LFTs x 3 months - Continue Repatha  140 mg q. 14 days  Ulcerative colitis Well-managed on mesalamine  -  Continue with routine follow-ups with gastroenterology      Dispo:  Return in about 6 months (around 07/27/2024).  Signed, Lum LITTIE Louis, NP

## 2024-01-27 NOTE — Patient Instructions (Addendum)
 Medication Instructions:  DECREASE ATORVASTATIN  TO 10 MG DAILY.  Lab Work: FASTING LIPID PANEL AND LFTs TO BE DONE IN 3 MONTHS (AROUND 04-30-24)  Testing/Procedures: NONE  Follow-Up: At [facility], you and your health needs are our priority.  As part of our continuing mission to provide you with exceptional heart care, our providers are all part of one team.  This team includes your primary Cardiologist (physician) and Advanced Practice Providers or APPs (Physician Assistants and Nurse Practitioners) who all work together to provide you with the care you need, when you need it.  Your next appointment:   6 MONTHS  Provider:   Oneil Parchment, MD OR Lum Louis, NP

## 2024-01-28 NOTE — Telephone Encounter (Signed)
 Called back, Patient discussed the lipid lab with Lum Louis , NP and they agreed to lower Lipitor  dose to 10 mg and continue with Repatha  every 14 days. Will repeat the lab in 3 months if LDLc goes above goal of 55 mg/dl will increase statin dose back to 20 mg.  Agree with the plan.

## 2024-02-03 NOTE — Telephone Encounter (Signed)
 See other encounter for more detail. Concern addressed over the phone.

## 2024-03-09 ENCOUNTER — Ambulatory Visit: Payer: BC Managed Care – PPO | Admitting: Family Medicine

## 2024-03-09 ENCOUNTER — Encounter: Payer: Self-pay | Admitting: Family Medicine

## 2024-03-09 VITALS — BP 116/72 | HR 74 | Temp 98.2°F | Resp 16 | Ht 72.0 in | Wt 192.1 lb

## 2024-03-09 DIAGNOSIS — R0683 Snoring: Secondary | ICD-10-CM | POA: Diagnosis not present

## 2024-03-09 DIAGNOSIS — E782 Mixed hyperlipidemia: Secondary | ICD-10-CM

## 2024-03-09 DIAGNOSIS — R5383 Other fatigue: Secondary | ICD-10-CM

## 2024-03-09 DIAGNOSIS — Z131 Encounter for screening for diabetes mellitus: Secondary | ICD-10-CM | POA: Diagnosis not present

## 2024-03-09 DIAGNOSIS — E785 Hyperlipidemia, unspecified: Secondary | ICD-10-CM

## 2024-03-09 DIAGNOSIS — R400 Somnolence: Secondary | ICD-10-CM | POA: Diagnosis not present

## 2024-03-09 DIAGNOSIS — Z0000 Encounter for general adult medical examination without abnormal findings: Secondary | ICD-10-CM

## 2024-03-09 DIAGNOSIS — Z23 Encounter for immunization: Secondary | ICD-10-CM

## 2024-03-09 LAB — CBC
HCT: 47.4 % (ref 39.0–52.0)
Hemoglobin: 15.9 g/dL (ref 13.0–17.0)
MCHC: 33.6 g/dL (ref 30.0–36.0)
MCV: 88.9 fl (ref 78.0–100.0)
Platelets: 300 K/uL (ref 150.0–400.0)
RBC: 5.33 Mil/uL (ref 4.22–5.81)
RDW: 13.2 % (ref 11.5–15.5)
WBC: 7 K/uL (ref 4.0–10.5)

## 2024-03-09 LAB — COMPREHENSIVE METABOLIC PANEL WITH GFR
ALT: 27 U/L (ref 0–53)
AST: 23 U/L (ref 0–37)
Albumin: 4.5 g/dL (ref 3.5–5.2)
Alkaline Phosphatase: 75 U/L (ref 39–117)
BUN: 14 mg/dL (ref 6–23)
CO2: 25 meq/L (ref 19–32)
Calcium: 9.7 mg/dL (ref 8.4–10.5)
Chloride: 104 meq/L (ref 96–112)
Creatinine, Ser: 0.85 mg/dL (ref 0.40–1.50)
GFR: 91.08 mL/min (ref >60.00)
Glucose, Bld: 86 mg/dL (ref 70–99)
Potassium: 4.3 meq/L (ref 3.5–5.1)
Sodium: 140 meq/L (ref 135–145)
Total Bilirubin: 0.7 mg/dL (ref 0.2–1.2)
Total Protein: 7.8 g/dL (ref 6.0–8.3)

## 2024-03-09 LAB — LIPID PANEL
Cholesterol: 113 mg/dL (ref 0–200)
HDL: 50.8 mg/dL (ref >39.00)
LDL Cholesterol: 28 mg/dL (ref 0–99)
NonHDL: 62.14
Total CHOL/HDL Ratio: 2
Triglycerides: 172 mg/dL — ABNORMAL HIGH (ref 0.0–149.0)
VLDL: 34.4 mg/dL (ref 0.0–40.0)

## 2024-03-09 LAB — TSH: TSH: 1.96 u[IU]/mL (ref 0.35–5.50)

## 2024-03-09 LAB — HEMOGLOBIN A1C: Hgb A1c MFr Bld: 5.5 % (ref 4.6–6.5)

## 2024-03-09 NOTE — Progress Notes (Signed)
 Subjective:  Patient ID: Dale Meadows, male    DOB: 1960-11-01  Age: 63 y.o. MRN: [id number]  CC:  Chief Complaint  Patient presents with   Annual Exam    Fasting Flu shot- will do today    HPI Dale Meadows presents for Annual Exam.  Concerns today: ER eval after lightheadedness few months ago. Home O2 was low 87-88% on home O2 sat. ER O2 92% rest, 94% ambulation.  CXR clear. Felt dyspneic then, but not recent. Able to play pickleball, basketball without any issues now.   Lipids as below - tolerating 10mg  lipitor .  Requests new levels today.   Fatigue: New concern/acute concern today. Noted past 3 months. General fatigue - wants to take nap/sleep. Can do activities, just more tired. Not refreshed in the morning. Cat naps at times. General fatigue. Sleeps 7-8 hours, not feeling rested. Nocturia 2-3 times d/t BPH. He does snore. No new CP/DOE with activity.    PCP, me Cardiology, Dr. Jeffrie, recent visit with Lum Louis, NP on 01/27/2024.  CAD treated with atorvastatin , Plavix , Repatha  for hyperlipidemia.  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  Intolerant to ACE/ARB/beta-blocker due to hypotension.  Lower dose of 10 mg atorvastatin  along with Repatha  now based on recent levels.  Lab Results  Component Value Date   CHOL 86 (L) 01/20/2024   HDL 49 01/20/2024   LDLCALC 24 01/20/2024   TRIG 50 01/20/2024   CHOLHDL 1.8 01/20/2024   Gastroenterology, Dr. Elicia, ulcerative colitis treated with mesalamine .  Yearly visits. Urology, Dr. Renda, history of elevated PSA, BPH with LUTS.  Yearly urology eval/PSA.Urology appointment 07/20/2023.  PSA 8.89, relatively stable.  Prior biopsy in 2012 in similar range.  Continued annual monitoring.    03/09/2024    8:10 AM 03/09/2023    8:36 AM 03/07/2022    8:38 AM 02/06/2022    1:38 PM 01/23/2022    1:43 PM  Depression screen PHQ 2/9  Decreased  Interest 0 0 0 0 0  Down, Depressed, Hopeless 0 0 0 0 0  PHQ - 2 Score 0 0 0 0 0  Altered sleeping 1 0 0 0 0  Tired, decreased energy 1 1 0 1 3  Change in appetite 0 0 0 0 0  Feeling bad or failure about yourself  0 0 0 0 0  Trouble concentrating 0 0 0 0 0  Moving slowly or fidgety/restless 0 0 0 0 0  Suicidal thoughts 0 0 0 0 0  PHQ-9 Score 2 1  0  1  3   Difficult doing work/chores Somewhat difficult    Somewhat difficult     Data saved with a previous flowsheet row definition    Health Maintenance  Topic Date Due   Medicare Annual Wellness (AWV)  Never done   HIV Screening  Never done   Hepatitis C Screening  Never done   Pneumococcal Vaccine: 50+ Years (1 of 2 - PCV) Never done   Influenza Vaccine  11/27/2023   COVID-19 Vaccine (4 - 2025-26 season) 12/28/2023   DTaP/Tdap/Td (3 - Td or Tdap) 10/25/2030   Colonoscopy  02/20/2032   Zoster Vaccines- Shingrix  Completed   Hepatitis B Vaccines 19-59 Average Risk  Aged Out   Meningococcal B Vaccine  Aged Out  Colon: colonoscopy with Dr. Elicia in past few years.  Prostate: prostate followed by urology - plan for repeat PSA in 1 year.  Immunization History  Administered Date(s) Administered   Influenza, Seasonal, Injecte, Preservative Fre 03/09/2023   Influenza,inj,Quad PF,6+ Mos 02/08/2014, 01/23/2022   Influenza-Unspecified 02/04/2021   PFIZER(Purple Top)SARS-COV-2 Vaccination 07/15/2019, 08/05/2019, 04/10/2020   Tdap 03/27/2008, 10/24/2020   Zoster Recombinant(Shingrix) 05/22/2020, 07/19/2020  Flu vaccine today Declines covid booster.  Pneumonia vaccine - prevnar 20 today.   No results found. Optho once per year.   Dental:Within Last 6 months  Alcohol:none  Tobacco: none.   Exercise: pickleball, basketball, weightlifting. Some walking. Exercise 2 days per week - few hours.   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 s/p inferior STEMI (07/2013)   Cancer (HCC) 05/29/2013   prostate   Hematuria    HLD (hyperlipidemia)    Junctional bradycardia    Myocardial infarction (HCC)    Seizures (HCC)    last seizure 26 years ago   Ulcerative colitis (HCC)    Ulcerative colitis (HCC)    Past Surgical History:  Procedure Laterality Date   ANTERIOR CRUCIATE LIGAMENT REPAIR Right 1990   BIOPSY PROSTATE     LEFT HEART CATHETERIZATION WITH CORONARY ANGIOGRAM N/A 07/27/2013   Procedure: LEFT HEART CATHETERIZATION WITH CORONARY ANGIOGRAM;  Surgeon: Candyce GORMAN Reek, MD;  Location: [facility] CATH LAB;  Service: Cardiovascular;  Laterality: N/A;   LEFT HEART CATHETERIZATION WITH CORONARY ANGIOGRAM N/A 07/29/2013   Procedure: LEFT HEART CATHETERIZATION WITH CORONARY ANGIOGRAM;  Surgeon: Victory LELON Claudene DOUGLAS, MD;  Location: [facility] CATH LAB;  Service: Cardiovascular;  Laterality: N/A;   No Known Allergies Prior to Admission  medications   Medication Sig Start Date End Date Taking? Authorizing Provider  atorvastatin  (LIPITOR ) 10 MG tablet Take 1 tablet (10 mg total) by mouth daily. 01/27/24 04/26/24 Yes Fountain, Madison L, NP  Cholecalciferol (VITAMIN D3) 10 MCG (400 UNIT) CAPS Take by mouth.   Yes [provider]  clopidogrel  (PLAVIX ) 75 MG tablet TAKE 1 TABLET BY MOUTH DAILY 12/29/23  Yes Jeffrie Oneil BROCKS, MD  Coenzyme Q10 (CO Q-10 PO) Take 1 capsule by mouth daily.   Yes [provider]  Evolocumab  (REPATHA  SURECLICK) 140 MG/ML SOAJ Inject 140 mg into the skin every 14 (fourteen) days. 09/14/23  Yes Jeffrie Oneil BROCKS, MD  LIALDA  1.2 G EC tablet Take 1.2 g by mouth daily  with breakfast.  10/05/14  Yes [provider]  nitroGLYCERIN  (NITROSTAT ) 0.4 MG SL tablet DISSOLVE 1 TABLET UNDER THE TONGUE EVERY 5 MINUTES AS  NEEDED FOR CHEST PAIN,NOT  TO EXCEED 3 DOSES IN 15 MIN IF PAIN PERSISTS, CALL 911 07/21/19  Yes Dann Candyce RAMAN, MD  vitamin B-12 (CYANOCOBALAMIN) 1000 MCG tablet Take 1,000 mcg by mouth daily.   Yes [provider]   Social History   Socioeconomic History   Marital status: Married    Spouse name: Not on file   Number of children: Not on file   Years of education: Not on file   Highest education level: Not on file  Occupational History   Not on file  Tobacco Use   Smoking status: Former    Types: Cigars    Quit date: 07/27/2008    Years since quitting: 15.6   Smokeless tobacco: Never  Vaping Use   Vaping status: Never Used  Substance and Sexual Activity   Alcohol use: Yes    Comment: occassional beer or wine, < 1 permonth   Drug use: Never   Sexual activity: Yes  Other Topics Concern   Not on file  Social History Narrative   Not on file   Social Drivers of Health   Financial Resource Strain: Patient Declined (03/05/2024)   Overall Financial Resource Strain (CARDIA)    Difficulty of Paying Living Expenses: Patient declined  Food Insecurity: Patient Declined  (03/05/2024)   Hunger Vital Sign    Worried About Running Out of Food in the Last Year: Patient declined    Ran Out of Food in the Last Year: Patient declined  Transportation Needs: Patient Declined (03/05/2024)   PRAPARE - Administrator, Civil Service (Medical): Patient declined    Lack of Transportation (Non-Medical): Patient declined  Physical Activity: Insufficiently Active (03/05/2024)   Exercise Vital Sign    Days of Exercise per Week: 2 days    Minutes of Exercise per Session: 60 min  Stress: No Stress Concern Present (03/05/2024)   [employer] of Occupational Health - Occupational Stress Questionnaire    Feeling of Stress: Only a little  Social Connections: Unknown (03/05/2024)   Social Connection and Isolation Panel    Frequency of Communication with Friends and Family: Patient declined    Frequency of Social Gatherings with Friends and Family: Patient declined    Attends Religious Services: Patient declined    Database Administrator or Organizations: Patient declined    Attends Engineer, Structural: Not on file    Marital Status: Patient declined  Intimate Partner Violence: Not on file    Review of Systems 13 point review of systems per patient health survey noted.  Negative other than as indicated above or in HPI.    Objective:   Vitals:   03/09/24 0801  BP: 116/72  Pulse: 74  Resp: 16  Temp: 98.2 F (36.8 C)  TempSrc: Oral  SpO2: 94%  Weight: 192 lb 2 oz (87.1 kg)  Height: 6' (1.829 m)     Physical Exam Vitals reviewed.  Constitutional:      Appearance: He is well-developed.  HENT:     Head: Normocephalic and atraumatic.     Right Ear: External ear normal.     Left Ear: External ear normal.  Eyes:     Conjunctiva/sclera: Conjunctivae normal.     Pupils: Pupils are equal, round, and reactive to light.  Neck:     Thyroid : No thyromegaly.  Cardiovascular:     Rate and Rhythm: Normal rate and regular rhythm.     Heart sounds:  Normal heart sounds.  Pulmonary:     Effort: Pulmonary effort is normal. No respiratory distress.     Breath sounds: Normal breath sounds. No wheezing.  Abdominal:     General: There is no distension.     Palpations: Abdomen is soft.     Tenderness: There is no abdominal tenderness.  Musculoskeletal:        General: No tenderness. Normal range of motion.     Cervical back: Normal range of motion and neck supple.  Lymphadenopathy:     Cervical: No cervical adenopathy.  Skin:    General: Skin is warm and dry.  Neurological:     Mental Status: He is alert and oriented to person, place, and time.     Deep Tendon Reflexes: Reflexes are normal and symmetric.  Psychiatric:        Behavior: Behavior normal.        Assessment & Plan:  Dale Meadows is a 63 y.o. male . Annual physical exam  - -anticipatory guidance as below in AVS, screening labs above. Health maintenance items as above in HPI discussed/recommended as applicable.   Need for influenza vaccination - Plan: Flu vaccine HIGH DOSE PF(Fluzone Trivalent)  Mixed hyperlipidemia - Plan: Comprehensive metabolic panel with GFR, Lipid panel  - With CAD as above, now on lower dose Lipitor , request updated labs on the 10 mg dose, labs as above and follow-up with cardiology as planned.  Fatigue, unspecified type - Plan: CBC, TSH, Ambulatory referral to Sleep Studies Snoring - Plan: Ambulatory referral to Sleep Studies Daytime somnolence  - Plan: Ambulatory referral to Sleep Studies  - With snoring, daytime somnolence, suspected underlying sleep apnea versus few episodes of nocturia may impact his quality of sleep.  ER visit and questionable hypoxia noted as above but has not had recurrence of symptoms.  If he does have any recurrent hypoxia or new dyspnea, pulmonary eval may be helpful in addition to his cardiology follow-up.  RTC precautions given.  Referred to sleep specialist for evaluation for possible underlying  OSA.  Hyperlipidemia, unspecified hyperlipidemia type  - As above, repeat labs, cardiology follow-up if needed for med adjustment.  Screening for diabetes mellitus - Plan: Hemoglobin A1c  Need for pneumococcal vaccination - Plan: Pneumococcal conjugate vaccine 20-valent (Prevnar 20) Prevnar 20 and flu vaccines given.   No orders of the defined types were placed in this encounter.  Patient Instructions  If any increased shortness of breath - be seen. I would recommend meeting with pulmonary if any recurrent symptoms.   I will check some labs for fatigue, but I think that sleep apnea could be a possibility for frequent wakening with urination may also be contributing.  I will refer you to sleep specialist.  See information below and fatigue.  If no cause found on labs or the sleep specialist, follow-up and we can look into other causes of fatigue.  Flu vaccine and pneumonia vaccine were given today.  If any concerns on labs I will let you know.  Take care!  Fatigue If you have fatigue, you feel tired all the time and have a lack of energy or a lack of motivation. Fatigue may make it difficult to start or complete tasks because of exhaustion. Occasional or mild fatigue is often a normal response to activity or life. However, long-term (chronic) or extreme fatigue may be a symptom of  a medical condition such as: Depression. Not having enough red blood cells or hemoglobin in the blood (anemia). A problem with a small gland located in the lower front part of the neck (thyroid  disorder). Rheumatologic conditions. These are problems related to the body's defense system (immune system). Infections, especially certain viral infections. Fatigue can also lead to negative health outcomes over time. Follow these instructions at home: Medicines Take over-the-counter and prescription medicines only as told by your health care provider. Take a multivitamin if told by your health care provider. Do not  use herbal or dietary supplements unless they are approved by your health care provider. Eating and drinking  Avoid heavy meals in the evening. Eat a well-balanced diet, which includes lean proteins, whole grains, plenty of fruits and vegetables, and low-fat dairy products. Avoid eating or drinking too many products with caffeine in them. Avoid alcohol. Drink enough fluid to keep your urine pale yellow. Activity  Exercise regularly, as told by your health care provider. Use or practice techniques to help you relax, such as yoga, tai chi, meditation, or massage therapy. Lifestyle Change situations that cause you stress. Try to keep your work and personal schedules in balance. Do not use recreational or illegal drugs. General instructions Monitor your fatigue for any changes. Go to bed and get up at the same time every day. Avoid fatigue by pacing yourself during the day and getting enough sleep at night. Maintain a healthy weight. Contact a health care provider if: Your fatigue does not get better. You have a fever. You suddenly lose or gain weight. You have headaches. You have trouble falling asleep or sleeping through the night. You feel angry, guilty, anxious, or sad. You have swelling in your legs or another part of your body. Get help right away if: You feel confused, feel like you might faint, or faint. Your vision is blurry or you have a severe headache. You have severe pain in your abdomen, your back, or the area between your waist and hips (pelvis). You have chest pain, shortness of breath, or an irregular or fast heartbeat. You are unable to urinate, or you urinate less than normal. You have abnormal bleeding from the rectum, nose, lungs, nipples, or, if you are male, the vagina. You vomit blood. You have thoughts about hurting yourself or others. These symptoms may be an emergency. Get help right away. Call 911. Do not wait to see if the symptoms will go away. Do  not drive yourself to the hospital. Get help right away if you feel like you may hurt yourself or others, or have thoughts about taking your own life. Go to your nearest emergency room or: Call 911. Call the National Suicide Prevention Lifeline at (819)468-9306 or 988. This is open 24 hours a day. Text the Crisis Text Line at 865-049-5446. Summary If you have fatigue, you feel tired all the time and have a lack of energy or a lack of motivation. Fatigue may make it difficult to start or complete tasks because of exhaustion. Long-term (chronic) or extreme fatigue may be a symptom of a medical condition. Exercise regularly, as told by your health care provider. Change situations that cause you stress. Try to keep your work and personal schedules in balance. This information is not intended to replace advice given to you by your health care provider. Make sure you discuss any questions you have with your health care provider. Document Revised: 02/04/2021 Document Reviewed: 02/04/2021 Elsevier Patient Education  2024 [employer].  Preventive Care 28 Years and Older, Male Preventive care refers to lifestyle choices and visits with your health care provider that can promote health and wellness. Preventive care visits are also called wellness exams. What can I expect for my preventive care visit? Counseling During your preventive care visit, your health care provider may ask about your: Medical history, including: Past medical problems. Family medical history. History of falls. Current health, including: Emotional well-being. Home life and relationship well-being. Sexual activity. Memory and ability to understand (cognition). Lifestyle, including: Alcohol, nicotine or tobacco, and drug use. Access to firearms. Diet, exercise, and sleep habits. Work and work astronomer. Sunscreen use. Safety issues such as seatbelt and bike helmet use. Physical exam Your health care provider will check  your: Height and weight. These may be used to calculate your BMI (body mass index). BMI is a measurement that tells if you are at a healthy weight. Waist circumference. This measures the distance around your waistline. This measurement also tells if you are at a healthy weight and may help predict your risk of certain diseases, such as type 2 diabetes and high blood pressure. Heart rate and blood pressure. Body temperature. Skin for abnormal spots. What immunizations do I need?  Vaccines are usually given at various ages, according to a schedule. Your health care provider will recommend vaccines for you based on your age, medical history, and lifestyle or other factors, such as travel or where you work. What tests do I need? Screening Your health care provider may recommend screening tests for certain conditions. This may include: Lipid and cholesterol levels. Diabetes screening. This is done by checking your blood sugar (glucose) after you have not eaten for a while (fasting). Hepatitis C test. Hepatitis B test. HIV (human immunodeficiency virus) test. STI (sexually transmitted infection) testing, if you are at risk. Lung cancer screening. Colorectal cancer screening. Prostate cancer screening. Abdominal aortic aneurysm (AAA) screening. You may need this if you are a current or former smoker. Talk with your health care provider about your test results, treatment options, and if necessary, the need for more tests. Follow these instructions at home: Eating and drinking  Eat a diet that includes fresh fruits and vegetables, whole grains, lean protein, and low-fat dairy products. Limit your intake of foods with high amounts of sugar, saturated fats, and salt. Take vitamin and mineral supplements as recommended by your health care provider. Do not drink alcohol if your health care provider tells you not to drink. If you drink alcohol: Limit how much you have to 0-2 drinks a day. Know how  much alcohol is in your drink. In the [place] one drink equals one 12 oz bottle of beer (355 mL), one 5 oz glass of wine (148 mL), or one 1 oz glass of hard liquor (44 mL). Lifestyle Brush your teeth every morning and night with fluoride toothpaste. Floss one time each day. Exercise for at least 30 minutes 5 or more days each week. Do not use any products that contain nicotine or tobacco. These products include cigarettes, chewing tobacco, and vaping devices, such as e-cigarettes. If you need help quitting, ask your health care provider. Do not use drugs. If you are sexually active, practice safe sex. Use a condom or other form of protection to prevent STIs. Take aspirin  only as told by your health care provider. Make sure that you understand how much to take and what form to take. Work with your health care provider to find out whether it is  safe and beneficial for you to take aspirin  daily. Ask your health care provider if you need to take a cholesterol-lowering medicine (statin). Find healthy ways to manage stress, such as: Meditation, yoga, or listening to music. Journaling. Talking to a trusted person. Spending time with friends and family. Safety Always wear your seat belt while driving or riding in a vehicle. Do not drive: If you have been drinking alcohol. Do not ride with someone who has been drinking. When you are tired or distracted. While texting. If you have been using any mind-altering substances or drugs. Wear a helmet and other protective equipment during sports activities. If you have firearms in your house, make sure you follow all gun safety procedures. Minimize exposure to UV radiation to reduce your risk of skin cancer. What's next? Visit your health care provider once a year for an annual wellness visit. Ask your health care provider how often you should have your eyes and teeth checked. Stay up to date on all vaccines. This information is not intended to replace  advice given to you by your health care provider. Make sure you discuss any questions you have with your health care provider. Document Revised: 10/10/2020 Document Reviewed: 10/10/2020 Elsevier Patient Education  2024 Elsevier Inc.    Signed,   Reyes Pines, MD [facility], [facility] 03/09/24 8:44 AM

## 2024-03-09 NOTE — Patient Instructions (Addendum)
 If any increased shortness of breath - be seen. I would recommend meeting with pulmonary if any recurrent symptoms.   I will check some labs for fatigue, but I think that sleep apnea could be a possibility for frequent wakening with urination may also be contributing.  I will refer you to sleep specialist.  See information below and fatigue.  If no cause found on labs or the sleep specialist, follow-up and we can look into other causes of fatigue.  Flu vaccine and pneumonia vaccine were given today.  If any concerns on labs I will let you know.  Take care!  Fatigue If you have fatigue, you feel tired all the time and have a lack of energy or a lack of motivation. Fatigue may make it difficult to start or complete tasks because of exhaustion. Occasional or mild fatigue is often a normal response to activity or life. However, long-term (chronic) or extreme fatigue may be a symptom of a medical condition such as: Depression. Not having enough red blood cells or hemoglobin in the blood (anemia). A problem with a small gland located in the lower front part of the neck (thyroid  disorder). Rheumatologic conditions. These are problems related to the body's defense system (immune system). Infections, especially certain viral infections. Fatigue can also lead to negative health outcomes over time. Follow these instructions at home: Medicines Take over-the-counter and prescription medicines only as told by your health care provider. Take a multivitamin if told by your health care provider. Do not use herbal or dietary supplements unless they are approved by your health care provider. Eating and drinking  Avoid heavy meals in the evening. Eat a well-balanced diet, which includes lean proteins, whole grains, plenty of fruits and vegetables, and low-fat dairy products. Avoid eating or drinking too many products with caffeine in them. Avoid alcohol. Drink enough fluid to keep your urine pale  yellow. Activity  Exercise regularly, as told by your health care provider. Use or practice techniques to help you relax, such as yoga, tai chi, meditation, or massage therapy. Lifestyle Change situations that cause you stress. Try to keep your work and personal schedules in balance. Do not use recreational or illegal drugs. General instructions Monitor your fatigue for any changes. Go to bed and get up at the same time every day. Avoid fatigue by pacing yourself during the day and getting enough sleep at night. Maintain a healthy weight. Contact a health care provider if: Your fatigue does not get better. You have a fever. You suddenly lose or gain weight. You have headaches. You have trouble falling asleep or sleeping through the night. You feel angry, guilty, anxious, or sad. You have swelling in your legs or another part of your body. Get help right away if: You feel confused, feel like you might faint, or faint. Your vision is blurry or you have a severe headache. You have severe pain in your abdomen, your back, or the area between your waist and hips (pelvis). You have chest pain, shortness of breath, or an irregular or fast heartbeat. You are unable to urinate, or you urinate less than normal. You have abnormal bleeding from the rectum, nose, lungs, nipples, or, if you are male, the vagina. You vomit blood. You have thoughts about hurting yourself or others. These symptoms may be an emergency. Get help right away. Call 911. Do not wait to see if the symptoms will go away. Do not drive yourself to the hospital. Get help right away if you  feel like you may hurt yourself or others, or have thoughts about taking your own life. Go to your nearest emergency room or: Call 911. Call the National Suicide Prevention Lifeline at 434-282-5345 or 988. This is open 24 hours a day. Text the Crisis Text Line at (508)813-2880. Summary If you have fatigue, you feel tired all the time and have  a lack of energy or a lack of motivation. Fatigue may make it difficult to start or complete tasks because of exhaustion. Long-term (chronic) or extreme fatigue may be a symptom of a medical condition. Exercise regularly, as told by your health care provider. Change situations that cause you stress. Try to keep your work and personal schedules in balance. This information is not intended to replace advice given to you by your health care provider. Make sure you discuss any questions you have with your health care provider. Document Revised: 02/04/2021 Document Reviewed: 02/04/2021 Elsevier Patient Education  2024 [employer].  Preventive Care 65 Years and Older, Male Preventive care refers to lifestyle choices and visits with your health care provider that can promote health and wellness. Preventive care visits are also called wellness exams. What can I expect for my preventive care visit? Counseling During your preventive care visit, your health care provider may ask about your: Medical history, including: Past medical problems. Family medical history. History of falls. Current health, including: Emotional well-being. Home life and relationship well-being. Sexual activity. Memory and ability to understand (cognition). Lifestyle, including: Alcohol, nicotine or tobacco, and drug use. Access to firearms. Diet, exercise, and sleep habits. Work and work astronomer. Sunscreen use. Safety issues such as seatbelt and bike helmet use. Physical exam Your health care provider will check your: Height and weight. These may be used to calculate your BMI (body mass index). BMI is a measurement that tells if you are at a healthy weight. Waist circumference. This measures the distance around your waistline. This measurement also tells if you are at a healthy weight and may help predict your risk of certain diseases, such as type 2 diabetes and high blood pressure. Heart rate and blood  pressure. Body temperature. Skin for abnormal spots. What immunizations do I need?  Vaccines are usually given at various ages, according to a schedule. Your health care provider will recommend vaccines for you based on your age, medical history, and lifestyle or other factors, such as travel or where you work. What tests do I need? Screening Your health care provider may recommend screening tests for certain conditions. This may include: Lipid and cholesterol levels. Diabetes screening. This is done by checking your blood sugar (glucose) after you have not eaten for a while (fasting). Hepatitis C test. Hepatitis B test. HIV (human immunodeficiency virus) test. STI (sexually transmitted infection) testing, if you are at risk. Lung cancer screening. Colorectal cancer screening. Prostate cancer screening. Abdominal aortic aneurysm (AAA) screening. You may need this if you are a current or former smoker. Talk with your health care provider about your test results, treatment options, and if necessary, the need for more tests. Follow these instructions at home: Eating and drinking  Eat a diet that includes fresh fruits and vegetables, whole grains, lean protein, and low-fat dairy products. Limit your intake of foods with high amounts of sugar, saturated fats, and salt. Take vitamin and mineral supplements as recommended by your health care provider. Do not drink alcohol if your health care provider tells you not to drink. If you drink alcohol: Limit how much you  have to 0-2 drinks a day. Know how much alcohol is in your drink. In the [place] one drink equals one 12 oz bottle of beer (355 mL), one 5 oz glass of wine (148 mL), or one 1 oz glass of hard liquor (44 mL). Lifestyle Brush your teeth every morning and night with fluoride toothpaste. Floss one time each day. Exercise for at least 30 minutes 5 or more days each week. Do not use any products that contain nicotine or tobacco. These  products include cigarettes, chewing tobacco, and vaping devices, such as e-cigarettes. If you need help quitting, ask your health care provider. Do not use drugs. If you are sexually active, practice safe sex. Use a condom or other form of protection to prevent STIs. Take aspirin  only as told by your health care provider. Make sure that you understand how much to take and what form to take. Work with your health care provider to find out whether it is safe and beneficial for you to take aspirin  daily. Ask your health care provider if you need to take a cholesterol-lowering medicine (statin). Find healthy ways to manage stress, such as: Meditation, yoga, or listening to music. Journaling. Talking to a trusted person. Spending time with friends and family. Safety Always wear your seat belt while driving or riding in a vehicle. Do not drive: If you have been drinking alcohol. Do not ride with someone who has been drinking. When you are tired or distracted. While texting. If you have been using any mind-altering substances or drugs. Wear a helmet and other protective equipment during sports activities. If you have firearms in your house, make sure you follow all gun safety procedures. Minimize exposure to UV radiation to reduce your risk of skin cancer. What's next? Visit your health care provider once a year for an annual wellness visit. Ask your health care provider how often you should have your eyes and teeth checked. Stay up to date on all vaccines. This information is not intended to replace advice given to you by your health care provider. Make sure you discuss any questions you have with your health care provider. Document Revised: 10/10/2020 Document Reviewed: 10/10/2020 Elsevier Patient Education  2024 [employer].

## 2024-03-12 ENCOUNTER — Ambulatory Visit: Payer: Self-pay | Admitting: Family Medicine

## 2024-04-04 ENCOUNTER — Encounter: Payer: Self-pay | Admitting: Student in an Organized Health Care Education/Training Program

## 2024-04-04 ENCOUNTER — Ambulatory Visit: Admitting: Student in an Organized Health Care Education/Training Program

## 2024-04-04 VITALS — BP 133/80 | HR 72 | Temp 98.3°F | Wt 197.0 lb

## 2024-04-04 DIAGNOSIS — J069 Acute upper respiratory infection, unspecified: Secondary | ICD-10-CM | POA: Insufficient documentation

## 2024-04-04 LAB — POC COVID19 BINAXNOW: SARS Coronavirus 2 Ag: NEGATIVE

## 2024-04-04 LAB — POCT INFLUENZA A/B
Influenza A, POC: NEGATIVE
Influenza B, POC: NEGATIVE

## 2024-04-04 NOTE — Patient Instructions (Signed)
  VISIT SUMMARY: You came in today because you have been experiencing a cough, chest congestion, and a runny nose since Friday afternoon. You also had a fever last night, which has since decreased. You are currently taking Lialda  for your Crohn's disease, which may make you more susceptible to infections.  YOUR PLAN: -ACUTE UPPER RESPIRATORY INFECTION: You have a viral infection affecting your sinuses, throat, and large airways. This type of infection usually improves with rest, hydration, and proper nutrition. You can take acetaminophen  and ibuprofen to help with discomfort. Please monitor your symptoms and let us  know if they get worse, especially if you have more coughing, fever, or feel more unwell. Stay home for 48 hours after your fever goes away.  -CROHN'S DISEASE: Your Crohn's disease is being managed with Lialda , which can weaken your immune system. You do not have any gastrointestinal symptoms right now, so continue taking Lialda  as prescribed.  INSTRUCTIONS: Please follow up if your symptoms worsen or do not improve. Stay home for 48 hours after your fever resolves. Continue taking Lialda  for your Crohn's disease as prescribed.

## 2024-04-04 NOTE — Progress Notes (Signed)
 Acute Office Visit  Patient ID: Dale Meadows, male    DOB: 01-30-1961, 63 y.o.   MRN: [id number]  PCP: [name], MD  Chief Complaint  Patient presents with   Cough    Cough and congestion started Friday. Fever started yesterday and did take OTC medication and did check temp today at noon and had a returning fever and took OTC medication for this.     Subjective:     HPI  Discussed the use of AI scribe software for clinical note transcription with the patient, who gave verbal consent to proceed.  History of Present Illness Dale Meadows is a 63 year old male with ulcerative colitis who presents with cough, chest congestion, and runny nose.  He has been experiencing a cough and chest congestion, along with a runny nose, which began on Friday afternoon. Initially, the cough worsened and then evolved into a runny nose with less frequent coughing. The cough is productive, often bringing up phlegm, and he describes a raspy noise when breathing initially.  He had a fever with a maximum temperature of 101F last night, which decreased to low 100s this morning. He has been taking Tylenol  to manage the fever. No ear pain or significant sinus pressure, although he used a nose strip to help with nasal congestion. No significant post-nasal drainage.  His wife has had a similar cough, and he has been using a humidifier at night. He denies any history of lung problems such as asthma or COPD. He is currently taking Lialda  for ulcerative colitis, which may contribute to some immunosuppression. No gastrointestinal symptoms or stomach issues at this time.  He is retired and has been staying home.      Objective:    BP 133/80   Pulse 72   Temp 98.3 F (36.8 C) (Oral)   Wt 197 lb (89.4 kg)   BMI 26.72 kg/m   Physical Exam  Gen: Well-appearing man, no frailty Ears: Mild erythema in both tympanic membranes, no middle ear effusion Mouth: Mild erythema in the posterior pharynx  with no exudate Neck: No tender adenopathy Heart: Distant sounds, regular, no murmur Lungs: Unlabored, clear throughout with no wheezing or crackles   Results for orders placed or performed in visit on 04/04/24  POCT Influenza A/B  Result Value Ref Range   Influenza A, POC Negative Negative   Influenza B, POC Negative Negative  POC COVID-19 BinaxNow  Result Value Ref Range   SARS Coronavirus 2 Ag Negative Negative       Assessment & Plan:   Problem List Items Addressed This Visit       Unprioritized   URTI (acute upper respiratory infection) - Primary   Symptoms are most consistent with a viral upper respiratory tract infection.  No signs of otitis media, sinusitis, or pneumonia on exam.  He is mildly immunocompromised on Lialda  for management of ulcerative colitis.  Point-of-care testing for flu and COVID were negative in the clinic.  I recommended continuing supportive care, we talked about safe use of Tylenol  and ibuprofen for discomforts.  Cough is minimal, not keeping him up at night.  He is at risk for progression to Communicare pneumonia given his immunosuppression.  I recommended he call or message me if he has worsening symptoms in the coming days, and I would prescribe an antibiotic to manage CAP.       Relevant Orders   POCT Influenza A/B (Completed)   POC COVID-19 BinaxNow (Completed)    Return  if symptoms worsen or fail to improve.  Cleatus Debby Specking, MD [facility]

## 2024-04-04 NOTE — Assessment & Plan Note (Signed)
 Symptoms are most consistent with a viral upper respiratory tract infection.  No signs of otitis media, sinusitis, or pneumonia on exam.  He is mildly immunocompromised on Lialda  for management of ulcerative colitis.  Point-of-care testing for flu and COVID were negative in the clinic.  I recommended continuing supportive care, we talked about safe use of Tylenol  and ibuprofen for discomforts.  Cough is minimal, not keeping him up at night.  He is at risk for progression to Communicare pneumonia given his immunosuppression.  I recommended he call or message me if he has worsening symptoms in the coming days, and I would prescribe an antibiotic to manage CAP.

## 2024-04-07 ENCOUNTER — Ambulatory Visit: Admitting: Neurology

## 2024-04-07 ENCOUNTER — Encounter: Payer: Self-pay | Admitting: Neurology

## 2024-04-07 VITALS — BP 108/79 | HR 78 | Ht 72.0 in | Wt 193.0 lb

## 2024-04-07 DIAGNOSIS — R0681 Apnea, not elsewhere classified: Secondary | ICD-10-CM

## 2024-04-07 DIAGNOSIS — I252 Old myocardial infarction: Secondary | ICD-10-CM | POA: Diagnosis not present

## 2024-04-07 DIAGNOSIS — E663 Overweight: Secondary | ICD-10-CM | POA: Diagnosis not present

## 2024-04-07 DIAGNOSIS — R0683 Snoring: Secondary | ICD-10-CM | POA: Diagnosis not present

## 2024-04-07 DIAGNOSIS — R351 Nocturia: Secondary | ICD-10-CM | POA: Diagnosis not present

## 2024-04-07 DIAGNOSIS — Z9189 Other specified personal risk factors, not elsewhere classified: Secondary | ICD-10-CM

## 2024-04-07 DIAGNOSIS — G4719 Other hypersomnia: Secondary | ICD-10-CM | POA: Diagnosis not present

## 2024-04-07 NOTE — Progress Notes (Signed)
 Subjective:    Patient ID: Dale Meadows is a 63 y.o. male.  HPI    True Mar, MD, PhD [facility] [address] [address] [place], [place] [address]  Dear Dr. Levora,   I saw your patient, Dale Meadows, upon your kind request in my sleep clinic today for initial consultation of his sleep disorder, in particular, concern for underlying obstructive sleep apnea.  The patient is unaccompanied today.  As you know, Dale Meadows is a 63 year old male with an underlying medical history of coronary artery disease with status post MI, hyperlipidemia, ulcerative colitis (on mesalamine ), history of seizures in the past, allergy, suspected prostate cancer, bradycardia, and overweight state, who reports snoring and excessive daytime somnolence, as well as witnessed apneas per wife's report.  His Epworth sleepiness score is 14 out of 24, fatigue severity score is 54 out of 63.  I reviewed your office note from 03/09/2024.  Sleep-related symptoms have been ongoing for some years.  He has no obvious family history of sleep apnea.  He is retired for the past 6 years, he worked in community education officer.  He lives with his wife, they have 3 grown children.  They have 2 small dogs in the household, 1 sleeps on the bed and 1 in the bedroom in a kennel.  He goes to bed generally around 830 or 9 and rise time is between 5 and 5:30 AM most mornings.  He has nocturia about 2-3 times per average night and denies recurrent nocturnal or morning headaches.  He is followed by urology for his elevated PSA and was previously suspected to have prostate cancer but they are monitoring him for this.  He drinks limited caffeine, usually 1 cup of coffee in the morning, no alcohol or rare alcohol only, he is a non-smoker.  Weight has been more or less stable for the past years.  They do not have a TV in the bedroom.  His Past Medical History Is Significant For: Past Medical History:  Diagnosis Date    Allergy    CAD (coronary artery disease)    a. s/p inferior STEMI (07/2013)   Cancer (HCC) 05/29/2013   prostate   Hematuria    HLD (hyperlipidemia)    Junctional bradycardia    Myocardial infarction (HCC)    Seizures (HCC)    last seizure 26 years ago   Ulcerative colitis (HCC)    Ulcerative colitis (HCC)     His Past Surgical History Is Significant For: Past Surgical History:  Procedure Laterality Date   ANTERIOR CRUCIATE LIGAMENT REPAIR Right 1990   BIOPSY PROSTATE     LEFT HEART CATHETERIZATION WITH CORONARY ANGIOGRAM N/A 07/27/2013   Procedure: LEFT HEART CATHETERIZATION WITH CORONARY ANGIOGRAM;  Surgeon: Candyce GORMAN Reek, MD;  Location: [facility] CATH LAB;  Service: Cardiovascular;  Laterality: N/A;   LEFT HEART CATHETERIZATION WITH CORONARY ANGIOGRAM N/A 07/29/2013   Procedure: LEFT HEART CATHETERIZATION WITH CORONARY ANGIOGRAM;  Surgeon: Victory LELON Claudene DOUGLAS, MD;  Location: [facility] CATH LAB;  Service: Cardiovascular;  Laterality: N/A;    His Family History Is Significant For: Family History  Problem Relation Age of Onset   Diabetes Mother    Hepatitis C Mother    Diabetes type II Mother    Hypertension Mother    Hearing loss Father    Asthma Father    Arthritis Father    Alcohol abuse Father    Hypertension Father    Drug abuse Brother    Alcohol abuse  Brother    Hypertension Brother    Deep vein thrombosis Brother    Hypertension Brother    Heart disease Neg Hx    Heart attack Neg Hx    Stroke Neg Hx    Sleep apnea Neg Hx     His Social History Is Significant For: Social History   Socioeconomic History   Marital status: Married    Spouse name: Not on file   Number of children: Not on file   Years of education: Not on file   Highest education level: Not on file  Occupational History   Not on file  Tobacco Use   Smoking status: Former    Types: Cigars    Quit date: 07/27/2008    Years since quitting: 15.7   Smokeless tobacco: Never  Vaping Use   Vaping  status: Never Used  Substance and Sexual Activity   Alcohol use: Yes    Comment: occassional beer or wine, < 1 permonth   Drug use: Never   Sexual activity: Yes  Other Topics Concern   Not on file  Social History Narrative   Pt lives with wife    Retired    Social Drivers of Health   Tobacco Use: Medium Risk (04/07/2024)   Patient History    Smoking Tobacco Use: Former    Smokeless Tobacco Use: Never    Passive Exposure: Not on file  Financial Resource Strain: Patient Declined (03/05/2024)   Overall Financial Resource Strain (CARDIA)    Difficulty of Paying Living Expenses: Patient declined  Food Insecurity: Patient Declined (03/05/2024)   Epic    Worried About Programme Researcher, Broadcasting/film/video in the Last Year: Patient declined    Barista in the Last Year: Patient declined  Transportation Needs: Patient Declined (03/05/2024)   Epic    Lack of Transportation (Medical): Patient declined    Lack of Transportation (Non-Medical): Patient declined  Physical Activity: Insufficiently Active (03/05/2024)   Exercise Vital Sign    Days of Exercise per Week: 2 days    Minutes of Exercise per Session: 60 min  Stress: No Stress Concern Present (03/05/2024)   [employer] of Occupational Health - Occupational Stress Questionnaire    Feeling of Stress: Only a little  Social Connections: Unknown (03/05/2024)   Social Connection and Isolation Panel    Frequency of Communication with Friends and Family: Patient declined    Frequency of Social Gatherings with Friends and Family: Patient declined    Attends Religious Services: Patient declined    Active Member of Clubs or Organizations: Patient declined    Attends Banker Meetings: Not on file    Marital Status: Patient declined  Depression (PHQ2-9): Low Risk (03/09/2024)   Depression (PHQ2-9)    PHQ-2 Score: 2  Alcohol Screen: Not on file  Housing: Patient Declined (03/05/2024)   Epic    Unable to Pay for Housing in the Last  Year: Patient declined    Number of Times Moved in the Last Year: Not on file    Homeless in the Last Year: Patient declined  Utilities: Not on file  Health Literacy: Not on file    His Allergies Are:  Allergies[1]:   His Current Medications Are:  Outpatient Encounter Medications as of 04/07/2024  Medication Sig   atorvastatin  (LIPITOR ) 10 MG tablet Take 1 tablet (10 mg total) by mouth daily.   Cholecalciferol (VITAMIN D3) 10 MCG (400 UNIT) CAPS Take by mouth.   clopidogrel  (PLAVIX ) 75 MG  tablet TAKE 1 TABLET BY MOUTH DAILY   Coenzyme Q10 (CO Q-10 PO) Take 1 capsule by mouth daily.   Evolocumab  (REPATHA  SURECLICK) 140 MG/ML SOAJ Inject 140 mg into the skin every 14 (fourteen) days.   LIALDA  1.2 G EC tablet Take 1.2 g by mouth daily with breakfast.    nitroGLYCERIN  (NITROSTAT ) 0.4 MG SL tablet DISSOLVE 1 TABLET UNDER THE TONGUE EVERY 5 MINUTES AS  NEEDED FOR CHEST PAIN,NOT  TO EXCEED 3 DOSES IN 15 MIN IF PAIN PERSISTS, CALL 911   vitamin B-12 (CYANOCOBALAMIN) 1000 MCG tablet Take 1,000 mcg by mouth daily.   No facility-administered encounter medications on file as of 04/07/2024.  :   Review of Systems:  Out of a complete 14 point review of systems, all are reviewed and negative with the exception of these symptoms as listed below:  Review of Systems  Objective:  Neurological Exam  Physical Exam Physical Examination:   Vitals:   04/07/24 1045  BP: 108/79  Pulse: 78    General Examination: The patient is a very pleasant 63 y.o. male in no acute distress. He appears well-developed and well-nourished and well groomed.   HEENT: Normocephalic, atraumatic, pupils are equal, round and reactive to light, extraocular tracking is good without limitation to gaze excursion or nystagmus noted. No photophobia. No Corrective eye glasses in place. Hearing is grossly intact.  Face is symmetric with normal facial animation. Speech is clear without dysarthria. There is no hypophonia. There  is no lip, neck/head, jaw or voice tremor. Neck is supple with full range of passive and active motion. There are no carotid bruits on auscultation.  Airway/Oropharynx exam reveals: mild mouth dryness, adequate dental hygiene with crowns on front top teeth, moderate airway crowding secondary to small airway entry and redundant soft palate, tonsils on the smaller side, Mallampati class III, neck circumference 16 inches, moderate to significant overbite noted.  Tongue protrudes centrally and palate elevates symmetrically.    Chest: Clear to auscultation without wheezing, rhonchi or crackles noted.  Heart: S1+S2+0, regular and normal without murmurs, rubs or gallops noted.   Abdomen: Soft, non-tender and non-distended.  Extremities: There is no pitting edema in the distal lower extremities bilaterally.   Skin: Warm and dry without trophic changes noted.   Musculoskeletal: exam reveals no obvious joint deformities.   Neurologically:  Mental status: The patient is awake, alert and oriented in all 4 spheres. His immediate and remote memory, attention, language skills and fund of knowledge are appropriate. There is no evidence of aphasia, agnosia, apraxia or anomia. Speech is clear with normal prosody and enunciation. Thought process is linear. Mood is normal and affect is normal.  Cranial nerves II - XII are as described above under HEENT exam.  Motor exam: Normal bulk, moving all 4 extremities without obvious restriction, no obvious action or resting tremor.  Fine motor skills and coordination: Intact grossly.  Cerebellar testing: No dysmetria or intention tremor. There is no truncal or gait ataxia.  Sensory exam: intact to light touch in the upper and lower extremities.  Gait, station and balance: He stands easily. No veering to one side is noted. No leaning to one side is noted. Posture is age-appropriate and stance is narrow based. Gait shows normal stride length and normal pace. No problems  turning are noted.   Assessment and plan:   In summary, Dale Meadows is a very pleasant 63 y.o.-year old male with an underlying medical history of coronary artery disease with status post  MI, hyperlipidemia, ulcerative colitis (on mesalamine ), history of seizures in the past, allergy, suspected prostate cancer, bradycardia, and overweight state, whose history and physical exam are concerning for sleep disordered breathing, particularly obstructive sleep apnea (OSA). A laboratory attended sleep study is typically considered gold standard for evaluation of sleep disordered breathing.   I had a long chat with the patient about my findings and the diagnosis of sleep apnea, particularly OSA, its prognosis and treatment options. We talked about medical/conservative treatments, surgical interventions and non-pharmacological approaches for symptom control. I explained, in particular, the risks and ramifications of untreated moderate to severe OSA, especially with respect to developing cardiovascular disease down the road, including congestive heart failure (CHF), difficult to treat hypertension, cardiac arrhythmias (particularly A-fib), neurovascular complications including TIA, stroke and dementia. Even type 2 diabetes has, in part, been linked to untreated OSA. Symptoms of untreated OSA may include (but may not be limited to) daytime sleepiness, nocturia (i.e. frequent nighttime urination), memory problems, mood irritability and suboptimally controlled or worsening mood disorder such as depression and/or anxiety, lack of energy, lack of motivation, physical discomfort, as well as recurrent headaches, especially morning or nocturnal headaches. We talked about the importance of maintaining a healthy lifestyle and striving for healthy weight. In addition, we talked about the importance of striving for and maintaining good sleep hygiene. I recommended a sleep study at this time. I outlined the differences between  a laboratory attended sleep study which is considered more comprehensive and accurate over the option of a home sleep test (HST); the latter may lead to underestimation of sleep disordered breathing in some instances and does not help with diagnosing upper airway resistance syndrome and is not accurate enough to diagnose primary central sleep apnea typically. I outlined possible surgical and non-surgical treatment options of OSA, including the use of a positive airway pressure (PAP) device (i.e. CPAP, AutoPAP/APAP or BiPAP in certain circumstances), a custom-made dental device (aka oral appliance, which would require a referral to a specialist dentist or orthodontist typically, and is generally speaking not considered for patients with full dentures or edentulous state), upper airway surgical options, such as traditional UPPP (which is not considered a first-line treatment) or the Inspire device (hypoglossal nerve stimulator, which would involve a referral for consultation with an ENT surgeon, after careful selection, following inclusion criteria - also not first-line treatment). I explained the PAP treatment option to the patient in detail, as this is generally considered first-line treatment.  The patient indicated that he would be willing to try PAP therapy, if the need arises. I explained the importance of being compliant with PAP treatment, not only for insurance purposes but primarily to improve patient's symptoms symptoms, and for the patient's long term health benefit, including to reduce His cardiovascular risks longer-term.    We will pick up our discussion about the next steps and treatment options after testing.  We will keep him posted as to the test results by phone call and/or MyChart messaging where possible.  We will plan to follow-up in sleep clinic accordingly as well.  I answered all his questions today and the patient was in agreement.   I encouraged him to call with any interim questions,  concerns, problems or updates or email us  through MyChart.  Generally speaking, sleep test authorizations may take up to 2 weeks, sometimes less, sometimes longer, the patient is encouraged to get in touch with us  if they do not hear back from the sleep lab staff directly within the  next 2 weeks.  Thank you very much for allowing me to participate in the care of this nice patient. If I can be of any further assistance to you please do not hesitate to call me at [phone].  Sincerely,   True Mar, MD, PhD      [1] No Known Allergies

## 2024-04-07 NOTE — Patient Instructions (Signed)

## 2024-04-11 ENCOUNTER — Telehealth: Payer: Self-pay | Admitting: Neurology

## 2024-04-11 NOTE — Telephone Encounter (Signed)
 [facility] HTA pending

## 2024-05-05 NOTE — Telephone Encounter (Signed)
 [facility]  HTA auth: [id number] (Exp. 04/11/24 to 07/10/24)

## 2024-05-05 NOTE — Telephone Encounter (Signed)
 Patient is scheduled at [facility] for 05/09/24 at 8 pm.  Sent mychart with appt information.  [facility] HTA auth: [id number] (exp. 04/11/24 to 07/10/24)

## 2024-05-09 ENCOUNTER — Ambulatory Visit (INDEPENDENT_AMBULATORY_CARE_PROVIDER_SITE_OTHER): Admitting: Neurology

## 2024-05-09 DIAGNOSIS — Z9189 Other specified personal risk factors, not elsewhere classified: Secondary | ICD-10-CM

## 2024-05-09 DIAGNOSIS — G4761 Periodic limb movement disorder: Secondary | ICD-10-CM

## 2024-05-09 DIAGNOSIS — R0683 Snoring: Secondary | ICD-10-CM | POA: Diagnosis not present

## 2024-05-09 DIAGNOSIS — R351 Nocturia: Secondary | ICD-10-CM

## 2024-05-09 DIAGNOSIS — G4720 Circadian rhythm sleep disorder, unspecified type: Secondary | ICD-10-CM

## 2024-05-09 DIAGNOSIS — R0681 Apnea, not elsewhere classified: Secondary | ICD-10-CM

## 2024-05-09 DIAGNOSIS — E663 Overweight: Secondary | ICD-10-CM

## 2024-05-09 DIAGNOSIS — I252 Old myocardial infarction: Secondary | ICD-10-CM

## 2024-05-09 DIAGNOSIS — G4719 Other hypersomnia: Secondary | ICD-10-CM

## 2024-05-13 ENCOUNTER — Telehealth: Payer: Self-pay | Admitting: Cardiology

## 2024-05-13 ENCOUNTER — Ambulatory Visit: Payer: Self-pay | Admitting: Neurology

## 2024-05-13 NOTE — Telephone Encounter (Signed)
 Pt requesting to do a provider switch to Dr. Annabella Scarce at [facility]. Please advise.

## 2024-05-13 NOTE — Procedures (Signed)
 Physician Interpretation: Please see link under Procedure Tab or under Encounters tab for physician report, technical report, as well as O2 titration and/or PAP titration tables (if applicable).

## 2024-05-30 NOTE — Telephone Encounter (Signed)
 Patient calling to f/u on provider switch. Please advise.

## 2025-03-10 ENCOUNTER — Encounter: Admitting: Family Medicine
